# Patient Record
Sex: Female | Born: 1950 | Race: White | Hispanic: No | Marital: Single
Health system: Midwestern US, Academic
[De-identification: ages and names within clinical notes are randomized; demographics above are authoritative.]

---

## 2016-07-03 MED ORDER — ENALAPRIL MALEATE 20 MG PO TAB
20 mg | ORAL_TABLET | Freq: Two times a day (BID) | ORAL | 3 refills | Status: DC
Start: 2016-07-03 — End: 2017-03-19

## 2016-07-03 MED ORDER — POTASSIUM 99 MG PO TAB
2 | Freq: Every day | ORAL | 0 refills | 30.00000 days | Status: DC
Start: 2016-07-03 — End: 2018-03-26

## 2016-07-03 MED ORDER — SPIRONOLACTONE 25 MG PO TAB
25 mg | ORAL_TABLET | Freq: Every day | ORAL | 3 refills | 90.00000 days | Status: DC
Start: 2016-07-03 — End: 2017-03-19

## 2016-07-03 MED ORDER — CARVEDILOL 25 MG PO TAB
25 mg | ORAL_TABLET | Freq: Two times a day (BID) | ORAL | 3 refills | 90.00000 days | Status: DC
Start: 2016-07-03 — End: 2017-03-19

## 2016-08-23 ENCOUNTER — Ambulatory Visit: Admit: 2016-08-23 | Discharge: 2016-08-24 | Payer: MEDICARE

## 2016-08-23 DIAGNOSIS — Z95810 Presence of automatic (implantable) cardiac defibrillator: Secondary | ICD-10-CM

## 2016-08-24 DIAGNOSIS — I472 Ventricular tachycardia: ICD-10-CM

## 2016-08-24 DIAGNOSIS — I428 Other cardiomyopathies: Principal | ICD-10-CM

## 2016-10-02 ENCOUNTER — Ambulatory Visit: Admit: 2016-10-02 | Discharge: 2016-10-03 | Payer: MEDICARE

## 2016-10-02 ENCOUNTER — Encounter: Admit: 2016-10-02 | Discharge: 2016-10-02 | Payer: MEDICARE

## 2016-10-02 DIAGNOSIS — I951 Orthostatic hypotension: ICD-10-CM

## 2016-10-02 DIAGNOSIS — I428 Other cardiomyopathies: ICD-10-CM

## 2016-10-02 DIAGNOSIS — Z95810 Presence of automatic (implantable) cardiac defibrillator: Principal | ICD-10-CM

## 2016-10-02 DIAGNOSIS — E7800 Pure hypercholesterolemia, unspecified: ICD-10-CM

## 2016-10-02 DIAGNOSIS — I472 Ventricular tachycardia: ICD-10-CM

## 2016-10-02 DIAGNOSIS — I509 Heart failure, unspecified: ICD-10-CM

## 2016-10-02 NOTE — Progress Notes
Date of Service: 10/02/2016    Gloria Houston is a 65 y.o. female.       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  We have recently seen a regression of LV function sufficient to warrant ICD implant earlier this year.  Although she initially had nonsustained VT during her index hospitalization in March 1999 provocative study was normal and ICD at that time was not required.    A low back he has severe LV dysfunction she does not have symptoms of congestion or edema and I do not think ever has.  This means that she has stage B NYHA functional class I LV systolic dysfunction but still had sufficient reduction in LV systolic performance to warrant ICD implant for primary prevention of sudden cardiac death.  [name] continues to be symptom-free with no angina or heart failure palpitations syncope or near syncope she is never had leg pain suggesting claudication.  She is very physically active.  She lives with her brother Fredrik Houston is also very active.  I suspect that both of them are in the top 1-5% of activity level for people their age.  She is on evidence-based triple therapy for survival enhancement and maintenance of LV systolic performance with carvedilol and enalapril and spironolactone.  She has no symptoms of medication intolerance she satisfied with her quality of life and her medical program.    She has had no new major medical problems develop since I have seen her last.  She states that she has felt her heart beating but it is just strong not rapid or associated with low output congestive symptoms.  She has had some symptoms of lightheadedness upon standing with no syncope or falls.  She is not limiting her activities to avoid this symptom.           Vitals:    10/02/16 1120 10/02/16 1126   BP: 102/58 106/64   Pulse:  75   Weight: 52.4 kg (115 lb 9.6 oz)    Height: 1.6 m (5' 3) Body mass index is 20.48 kg/m???.     Past Medical History  Patient Active Problem List    Diagnosis Date Noted   ??? Idiopathic cardiomyopathy (HCC) 12/18/2008     Priority: High     a.  05/12/1997 - Cardiac Cath: EF 30%. No coronary disease  b. 2004 Echo Dr. Fran Lowes EF 40%, ToprolXL 200 from 1999-2005  c.  2/05 Reenters MAC Switch to Coreg 25 BID, then feels better  d. 7/05 Echo EF 45% LVEDD 5.1 cm  e. 7/06 Echo EF 45% LVEDD 5.6  Aspirin 81/day offered as antiplatelet agent that could possibly offset platelet aggregation effects of Ibu taken for arthritis.   f. 09/03/06  Stress Echo 12.5 min Bruce protocol treadmill: EF 45% w/ improvement w/ exercise  G. 08/17/11 Echo: EF 50%, LVEDD 5.4 LV function appears similar or slightly better than in 2008.   H. 10/13 Reg thall to assess flow and  LV Fx p Deep T inversion on EKGs:  EF by MPI 45%, no ischemia/infarct.  Echo EF 35%:    I 3//2018 Regad T-103m Scan: EF 14% LVEDV 240, No ischemia or infarct. Marked decline in LVEF since prior study.      ??? [employer] ICD in place 05/25/2016     05/24/16 left  sided DDD ICD placed Dr. Clint Bolder     ??? Congestive  heart failure, NYHA class 1 and ACC/AHA stage B [facility]) 05/24/2016     05/24/2016 [employer] dual chamber ICD      ??? NSVT (nonsustained ventricular tachycardia) (HCC) 05/24/2016   ??? Nonsustained ventricular tachycardia (HCC) 12/18/2008      a.  05/1997 - NonSustained VT noted during hospitalization to evaluate cardiomyopathy.   b.  05/13/1997 - Ventricular stimulation study by Dr. Clint Bolder: no sustained arrhythmias.     ??? Hypercholesterolemia 12/18/2008     a.  08/21/2002 - Total 196, trig 103, HDL 69, LDL 106; no meds  b. 6/06 total 183 trig 63 HDL 52 LDL 100 no meds  c. 10/06 total 144 trig 60 HDL 61 LDLd 61 Lipitor 10, DC'd   d. 08/26/06 total 197 trig 91 HDL 77 LDL 101  E. 03/10/10 lipids with total 233 trig 69 HDL 82 and LDL 120 no statins  F. 02/14/11 Total 234 Trig 104 HDL 80 LDL 132 no meds. 7/13 inf T inversion on EKG, Lipiitor 10 resumed, stress Test>>No ischemia  G 4.2015 Stops Atorvastatin 5 d.t myalgias, Tolerating simvastatin 20 as substituete 12/2013.           Review of Systems   Constitution: Negative.   HENT: Negative.    Eyes: Negative.    Cardiovascular: Positive for irregular heartbeat and near-syncope.   Respiratory: Negative.    Endocrine: Negative.    Hematologic/Lymphatic: Negative.    Skin: Negative.    Musculoskeletal: Positive for arthritis.   Gastrointestinal: Positive for diarrhea.   Genitourinary: Negative.    Neurological: Positive for dizziness and light-headedness.   Psychiatric/Behavioral: Negative.    Allergic/Immunologic: Negative.        Physical Exam  General: Patient looks generally healthy.  Appearance consistent with BMI calculated at 23  Skin warm and dry, non icteric,       Her left-sided ICD incision ishealed.  Pupils equal and round         Thyroid not enlarged.  Neck veins: CVP <6 normal, no V wave, No hepato-jugular reflux   Carotids: no bruits  Respiratory: Breathing comfortably. Lungs clear to auscultation and percussion.   Cardiac: Regular rhythm.   Fourth heart sound, no rub or third sound. No murmur  Abdomen soft, non-tender, no masses or bruits   Legs/feet: Adequate PT pulses, no edema.   Motor: Normal muscle strength.      Cognitive: Good insight, clear historian, no depression     Cardiovascular Studies  EKG shows predominance of AV pacing with some limited view of neuro complex sinus beats in the lateral precordial leads.  Problems Addressed Today  Encounter Diagnoses   Name Primary?   ??? Congestive heart failure, NYHA class 1 and ACC/AHA stage B (HCC) Yes   ??? [employer] ICD in place    ??? Idiopathic cardiomyopathy (HCC)    ??? Orthostatic hypotension        Assessment and Plan     Dierra is doing extremely well.  She has no findings of fluid overload.  The only thing on exam reveals cardiac problem is her ICD in the left chest.  I added spironolactone last visit with stable potassium.    She has had some lightheadedness which I think is related to volume contraction in the heat and her medications.  I told her to be cautious as she gets up and down in the summer.    She told me that she could feel her heart beating which told her is normal in  the absence of tachypalpitations.  Her blood pressure is satisfactory.  She asked about flying and I told her that I really had no reservations about her traveling anywhere in the world on his she kept herself reasonably close to medical care.  I used travel [place] [employer] is the travel location that I thought would be ill advised that anywhere in the  [place] I think would be fine.    When I saw her last visit in April noted that she was to highly functional to warrant conversion to [facility] based on current indications and data.  I will see her again in 4-6 months with a six-month visit being in January.  I think she is hard enough to make the trip even in bad weather.  NB: This document was prepared within the Epic(TM) EMR using templates developed by the [facility] of [facility]. It can be accessed online through [facility] feature by clinicians at other Epic institutions.  The free text in this document, was generated through Dragon(TM) software.  Editing and proofreading were done by the author of this document Dr. Mable Paris MD, [facility] principally at the point of care.  In spite of the author's best effort to identify every error introduced by voice to text dictation, some errors that may represent misspelling or misstatements of what was dictated may persist.  If there are questions about content in this document please contact Dr. Hale Bogus.    The written information I provided Ms. Mcneel at the conclusion of today's encounter is as  follows:   Patient Instructions   You are doing very well.  Your exam shows no evidence of fluid overload. The only clue on examthat you have some heart problem is your defibrillator.  Potassium did not go up after the spironolactone.  It was 4.2 baseline and 4.1 on May 14.  Both of these are normal.    The heart protection medicines tend to reduce her body's ability to tighten the blood vessels against gravity.  That tends to make you more lightheaded and dizzy particularly when you stand up suddenly.  If you are dehydrated which is more likely in the summer that will also reduce her blood pressure and contribute to dizziness.  That is why the heat can bother you more because she will lose more fluid and be more likely to be dehydrated and get lower blood pressure and feel dizzy.    Feeling the beat of your heart in the chest is a good thing.  I had a heart transplant patient tell me once that he woke up after the heart transplant and felt his heart beating.  That was the first time he felt his heartbeat in 5 years because his heart is been too weak for him to feel the beats.  It is only of the beats are going extremely fast that you should be concerned.  Your pacemaker incision is barely visible.  There is still a little fluid around the pacemaker that will resorb over the next couple of months.    Her blood pressure is unchanged from the time before the spironolactone.  Your heart medications that strengthen and protect your heart include carvedilol 25 mg twice daily Vasotec 20 mg twice daily and spironolactone 25 mg daily those are all solid doses of highly beneficial medications.  Stay with them as I see no harm from them and I know they are providing you protection.    I have  no reservations about you flying anywhere in the world.  You are smart about what you would set out to do and give yourself a margin for air rather than doing something really crazy like trying to climb [place].    He will think you need another follow-up visit for 6 months.  That would mean coming in in January.  I think you are hard enough to come in in January.  I am telling the febrile 66 year old's to either come in in December or March.     Call in if you have problems or questions.   Marissa Nestle, MD              Current Medications (including today's revisions)  ??? ascorbic acid (VITAMIN C) 500 mg PO tablet Take 500 mg by mouth Daily.   ??? Aspirin 81 mg PO Tab Take 1 Tab by mouth Daily.   ??? CALCIUM CARBONATE/VITAMIN D3 (CALCIUM 500 + D PO) Take  by mouth. 1 tablet by mouth three times a day    ??? carvedilol (COREG) 25 mg tablet Take 1 tablet by mouth twice daily with meals.   ??? enalapril (VASOTEC) 20 mg tablet Take 1 tablet by mouth twice daily.   ??? ibuprofen (MOTRIN) 200 mg PO tablet Take 3 Tabs by mouth twice daily as needed.   ??? LACTOBACILLUS ACIDOPHILUS (FLORAJEN PO) Take  by mouth daily.   ??? multivitamin (THERAGRAN) PO per tablet Take 1 Tab by mouth Daily.   ??? omega-3s-dha-epa-fish oil 1,400 mg/5 mL liqd Take 1 tablet by mouth daily.   ??? OMEGA-3S/DHA/EPA/FISH OIL/D3 (VITAMIN-D + OMEGA-3 PO) Take 5,000 mg by mouth daily.   ??? Potassium 99 mg tab Take 2 tablets by mouth daily.   ??? spironolactone (ALDACTONE) 25 mg tablet Take 1 tablet by mouth daily. Take with food.   ??? vitamins, B complex tab Take 1 Tab by mouth daily.

## 2016-10-05 ENCOUNTER — Encounter: Admit: 2016-10-05 | Discharge: 2016-10-05 | Payer: MEDICARE

## 2016-10-05 DIAGNOSIS — I428 Other cardiomyopathies: Principal | ICD-10-CM

## 2016-10-05 DIAGNOSIS — E7800 Pure hypercholesterolemia, unspecified: ICD-10-CM

## 2016-10-05 DIAGNOSIS — I472 Ventricular tachycardia: ICD-10-CM

## 2016-11-22 ENCOUNTER — Ambulatory Visit: Admit: 2016-11-22 | Discharge: 2016-11-23 | Payer: MEDICARE

## 2016-11-23 DIAGNOSIS — I255 Ischemic cardiomyopathy: Principal | ICD-10-CM

## 2016-11-23 DIAGNOSIS — Z95810 Presence of automatic (implantable) cardiac defibrillator: ICD-10-CM

## 2016-11-23 DIAGNOSIS — I472 Ventricular tachycardia: ICD-10-CM

## 2016-12-06 ENCOUNTER — Ambulatory Visit: Admit: 2016-12-06 | Discharge: 2016-12-07 | Payer: MEDICARE

## 2016-12-06 DIAGNOSIS — I5022 Chronic systolic (congestive) heart failure: ICD-10-CM

## 2016-12-06 DIAGNOSIS — Z959 Presence of cardiac and vascular implant and graft, unspecified: ICD-10-CM

## 2016-12-06 DIAGNOSIS — Z95810 Presence of automatic (implantable) cardiac defibrillator: Principal | ICD-10-CM

## 2017-02-21 ENCOUNTER — Ambulatory Visit: Admit: 2017-02-21 | Discharge: 2017-02-22 | Payer: MEDICARE

## 2017-02-21 DIAGNOSIS — I472 Ventricular tachycardia: Secondary | ICD-10-CM

## 2017-02-22 DIAGNOSIS — I255 Ischemic cardiomyopathy: Principal | ICD-10-CM

## 2017-02-22 DIAGNOSIS — Z95810 Presence of automatic (implantable) cardiac defibrillator: ICD-10-CM

## 2017-03-19 ENCOUNTER — Encounter: Admit: 2017-03-19 | Discharge: 2017-03-19 | Payer: MEDICARE

## 2017-03-19 ENCOUNTER — Ambulatory Visit: Admit: 2017-03-19 | Discharge: 2017-03-20 | Payer: MEDICARE

## 2017-03-19 DIAGNOSIS — I509 Heart failure, unspecified: ICD-10-CM

## 2017-03-19 DIAGNOSIS — I429 Cardiomyopathy, unspecified: ICD-10-CM

## 2017-03-19 DIAGNOSIS — I420 Dilated cardiomyopathy: ICD-10-CM

## 2017-03-19 DIAGNOSIS — I472 Ventricular tachycardia: ICD-10-CM

## 2017-03-19 DIAGNOSIS — Z95810 Presence of automatic (implantable) cardiac defibrillator: Principal | ICD-10-CM

## 2017-03-19 DIAGNOSIS — I428 Other cardiomyopathies: Principal | ICD-10-CM

## 2017-03-19 DIAGNOSIS — E7800 Pure hypercholesterolemia, unspecified: ICD-10-CM

## 2017-03-19 LAB — COMPREHENSIVE METABOLIC PANEL
Lab: 1
Lab: 106
Lab: 140
Lab: 25
Lab: 30 — ABNORMAL HIGH (ref 9.8–20.1)
Lab: 6.5
Lab: 9.9
Lab: 94

## 2017-03-19 LAB — MAGNESIUM: Lab: 2.3

## 2017-03-19 MED ORDER — ENALAPRIL MALEATE 20 MG PO TAB
20 mg | ORAL_TABLET | Freq: Two times a day (BID) | ORAL | 3 refills | Status: AC
Start: 2017-03-19 — End: 2018-03-25

## 2017-03-19 MED ORDER — SPIRONOLACTONE 25 MG PO TAB
25 mg | ORAL_TABLET | Freq: Every day | ORAL | 3 refills | 46.00000 days | Status: AC
Start: 2017-03-19 — End: 2018-03-25

## 2017-03-19 MED ORDER — CARVEDILOL 25 MG PO TAB
25 mg | ORAL_TABLET | Freq: Two times a day (BID) | ORAL | 3 refills | 90.00000 days | Status: AC
Start: 2017-03-19 — End: 2018-03-25

## 2017-03-20 ENCOUNTER — Encounter: Admit: 2017-03-20 | Discharge: 2017-03-20 | Payer: MEDICARE

## 2017-03-20 DIAGNOSIS — Z95810 Presence of automatic (implantable) cardiac defibrillator: Principal | ICD-10-CM

## 2017-03-20 DIAGNOSIS — I420 Dilated cardiomyopathy: ICD-10-CM

## 2017-03-20 DIAGNOSIS — I472 Ventricular tachycardia: ICD-10-CM

## 2017-03-20 DIAGNOSIS — I429 Cardiomyopathy, unspecified: ICD-10-CM

## 2017-05-24 ENCOUNTER — Ambulatory Visit: Admit: 2017-05-23 | Discharge: 2017-05-24 | Payer: MEDICARE

## 2017-05-24 DIAGNOSIS — Z95810 Presence of automatic (implantable) cardiac defibrillator: ICD-10-CM

## 2017-05-24 DIAGNOSIS — I255 Ischemic cardiomyopathy: Principal | ICD-10-CM

## 2017-05-24 DIAGNOSIS — I472 Ventricular tachycardia: ICD-10-CM

## 2017-06-13 ENCOUNTER — Encounter: Admit: 2017-06-13 | Discharge: 2017-06-13 | Payer: MEDICARE

## 2017-06-13 DIAGNOSIS — I428 Other cardiomyopathies: Principal | ICD-10-CM

## 2017-06-13 DIAGNOSIS — E7800 Pure hypercholesterolemia, unspecified: ICD-10-CM

## 2017-06-13 DIAGNOSIS — I472 Ventricular tachycardia: ICD-10-CM

## 2017-08-22 ENCOUNTER — Ambulatory Visit: Admit: 2017-08-22 | Discharge: 2017-08-23 | Payer: MEDICARE

## 2017-08-23 DIAGNOSIS — Z95810 Presence of automatic (implantable) cardiac defibrillator: ICD-10-CM

## 2017-08-23 DIAGNOSIS — I472 Ventricular tachycardia: ICD-10-CM

## 2017-08-23 DIAGNOSIS — I255 Ischemic cardiomyopathy: Principal | ICD-10-CM

## 2017-09-17 ENCOUNTER — Ambulatory Visit: Admit: 2017-09-17 | Discharge: 2017-09-18 | Payer: MEDICARE

## 2017-09-17 ENCOUNTER — Encounter: Admit: 2017-09-17 | Discharge: 2017-09-17 | Payer: MEDICARE

## 2017-09-17 DIAGNOSIS — E7800 Pure hypercholesterolemia, unspecified: ICD-10-CM

## 2017-09-17 DIAGNOSIS — R011 Cardiac murmur, unspecified: ICD-10-CM

## 2017-09-17 DIAGNOSIS — I509 Heart failure, unspecified: ICD-10-CM

## 2017-09-17 DIAGNOSIS — I420 Dilated cardiomyopathy: ICD-10-CM

## 2017-09-17 DIAGNOSIS — I428 Other cardiomyopathies: Principal | ICD-10-CM

## 2017-09-17 DIAGNOSIS — I472 Ventricular tachycardia: ICD-10-CM

## 2017-09-17 LAB — BASIC METABOLIC PANEL
Lab: 1.1 meq/L — ABNORMAL HIGH
Lab: 10 — ABNORMAL HIGH (ref 8.4–10.2)
Lab: 105 g/dL (ref 3.4–4.8)
Lab: 12
Lab: 140 mg/dL (ref 0.3–1.2)
Lab: 28 mg/dL (ref 0.57–1.11)
Lab: 28 ml/min/1.73 m2 — ABNORMAL HIGH (ref 9.8–20.1)
Lab: 5.1 g/dL
Lab: 88

## 2017-09-17 LAB — BNP (B-TYPE NATRIURETIC PEPTI): Lab: 105 IU/L — ABNORMAL HIGH (ref 0–100)

## 2017-09-18 ENCOUNTER — Encounter: Admit: 2017-09-18 | Discharge: 2017-09-18 | Payer: MEDICARE

## 2017-09-18 DIAGNOSIS — I472 Ventricular tachycardia: ICD-10-CM

## 2017-09-18 DIAGNOSIS — E7800 Pure hypercholesterolemia, unspecified: Principal | ICD-10-CM

## 2017-09-18 DIAGNOSIS — I509 Heart failure, unspecified: ICD-10-CM

## 2017-09-24 ENCOUNTER — Encounter: Admit: 2017-09-24 | Discharge: 2017-09-24 | Payer: MEDICARE

## 2017-09-24 ENCOUNTER — Ambulatory Visit: Admit: 2017-09-24 | Discharge: 2017-09-25 | Payer: MEDICARE

## 2017-09-24 DIAGNOSIS — I509 Heart failure, unspecified: Principal | ICD-10-CM

## 2017-10-14 ENCOUNTER — Encounter: Admit: 2017-10-14 | Discharge: 2017-10-14 | Payer: MEDICARE

## 2017-11-21 ENCOUNTER — Ambulatory Visit: Admit: 2017-11-21 | Discharge: 2017-11-22 | Payer: MEDICARE

## 2017-11-21 DIAGNOSIS — Z95810 Presence of automatic (implantable) cardiac defibrillator: Secondary | ICD-10-CM

## 2017-11-22 DIAGNOSIS — I472 Ventricular tachycardia: ICD-10-CM

## 2017-11-22 DIAGNOSIS — I255 Ischemic cardiomyopathy: Principal | ICD-10-CM

## 2017-12-04 ENCOUNTER — Encounter: Admit: 2017-12-04 | Discharge: 2017-12-04 | Payer: MEDICARE

## 2017-12-04 DIAGNOSIS — I472 Ventricular tachycardia: Principal | ICD-10-CM

## 2018-02-20 ENCOUNTER — Ambulatory Visit: Admit: 2018-02-20 | Discharge: 2018-02-20 | Payer: MEDICARE

## 2018-02-20 DIAGNOSIS — Z95810 Presence of automatic (implantable) cardiac defibrillator: ICD-10-CM

## 2018-02-20 DIAGNOSIS — I472 Ventricular tachycardia: ICD-10-CM

## 2018-02-20 DIAGNOSIS — I255 Ischemic cardiomyopathy: Principal | ICD-10-CM

## 2018-03-25 ENCOUNTER — Encounter: Admit: 2018-03-25 | Discharge: 2018-03-25 | Payer: MEDICARE

## 2018-03-25 ENCOUNTER — Ambulatory Visit: Admit: 2018-03-25 | Discharge: 2018-03-25 | Payer: MEDICARE

## 2018-03-25 DIAGNOSIS — Z95810 Presence of automatic (implantable) cardiac defibrillator: Secondary | ICD-10-CM

## 2018-03-25 DIAGNOSIS — I509 Heart failure, unspecified: Secondary | ICD-10-CM

## 2018-03-25 DIAGNOSIS — I472 Ventricular tachycardia: Secondary | ICD-10-CM

## 2018-03-25 DIAGNOSIS — I429 Cardiomyopathy, unspecified: Secondary | ICD-10-CM

## 2018-03-25 DIAGNOSIS — I428 Other cardiomyopathies: Secondary | ICD-10-CM

## 2018-03-25 DIAGNOSIS — E7800 Pure hypercholesterolemia, unspecified: Secondary | ICD-10-CM

## 2018-03-25 LAB — BASIC METABOLIC PANEL
Lab: 106
Lab: 139
Lab: 5.5 — ABNORMAL HIGH (ref 3.5–5.1)

## 2018-03-25 MED ORDER — CARVEDILOL 25 MG PO TAB
25 mg | ORAL_TABLET | Freq: Two times a day (BID) | ORAL | 3 refills | 90.00000 days | Status: AC
Start: 2018-03-25 — End: 2019-04-09

## 2018-03-25 MED ORDER — SPIRONOLACTONE 25 MG PO TAB
25 mg | ORAL_TABLET | Freq: Every day | ORAL | 3 refills | 90.00000 days | Status: AC
Start: 2018-03-25 — End: 2018-03-26

## 2018-03-25 MED ORDER — ENALAPRIL MALEATE 20 MG PO TAB
20 mg | ORAL_TABLET | Freq: Two times a day (BID) | ORAL | 3 refills | Status: AC
Start: 2018-03-25 — End: 2019-04-08

## 2018-03-26 ENCOUNTER — Encounter: Admit: 2018-03-26 | Discharge: 2018-03-26 | Payer: MEDICARE

## 2018-03-26 DIAGNOSIS — I420 Dilated cardiomyopathy: Secondary | ICD-10-CM

## 2018-03-26 DIAGNOSIS — E875 Hyperkalemia: Secondary | ICD-10-CM

## 2018-03-26 DIAGNOSIS — I509 Heart failure, unspecified: Secondary | ICD-10-CM

## 2018-03-26 DIAGNOSIS — I429 Cardiomyopathy, unspecified: Secondary | ICD-10-CM

## 2018-03-26 DIAGNOSIS — Z95810 Presence of automatic (implantable) cardiac defibrillator: Secondary | ICD-10-CM

## 2018-03-26 MED ORDER — SPIRONOLACTONE 25 MG PO TAB
12.5 mg | ORAL_TABLET | Freq: Every day | ORAL | 3 refills | 90.00000 days | Status: AC
Start: 2018-03-26 — End: 2019-09-17

## 2018-04-02 ENCOUNTER — Encounter: Admit: 2018-04-02 | Discharge: 2018-04-02 | Payer: MEDICARE

## 2018-04-02 DIAGNOSIS — I509 Heart failure, unspecified: Secondary | ICD-10-CM

## 2018-04-02 DIAGNOSIS — I420 Dilated cardiomyopathy: Secondary | ICD-10-CM

## 2018-04-02 DIAGNOSIS — E875 Hyperkalemia: Secondary | ICD-10-CM

## 2018-04-02 LAB — BASIC METABOLIC PANEL
Lab: 10 U/L (ref 25–110)
Lab: 10 U/L (ref 7–56)
Lab: 110 g/dL — ABNORMAL HIGH (ref 70–105)
Lab: 138 mg/dL — ABNORMAL HIGH (ref 70–100)
Lab: 4.4 mg/dL (ref 7–25)

## 2018-04-12 ENCOUNTER — Encounter: Admit: 2018-04-12 | Discharge: 2018-04-12 | Payer: MEDICARE

## 2018-04-12 DIAGNOSIS — Z95810 Presence of automatic (implantable) cardiac defibrillator: Principal | ICD-10-CM

## 2018-05-22 ENCOUNTER — Ambulatory Visit: Admit: 2018-05-22 | Discharge: 2018-05-23 | Payer: MEDICARE

## 2018-05-22 DIAGNOSIS — I472 Ventricular tachycardia: Secondary | ICD-10-CM

## 2018-05-22 DIAGNOSIS — Z95810 Presence of automatic (implantable) cardiac defibrillator: Secondary | ICD-10-CM

## 2018-05-23 ENCOUNTER — Encounter: Admit: 2018-05-23 | Discharge: 2018-05-23 | Payer: MEDICARE

## 2018-05-23 DIAGNOSIS — I255 Ischemic cardiomyopathy: Principal | ICD-10-CM

## 2018-08-21 ENCOUNTER — Ambulatory Visit: Admit: 2018-08-21 | Discharge: 2018-08-22

## 2018-08-21 DIAGNOSIS — Z95810 Presence of automatic (implantable) cardiac defibrillator: Secondary | ICD-10-CM

## 2018-08-22 ENCOUNTER — Encounter: Admit: 2018-08-22 | Discharge: 2018-08-22

## 2018-08-22 DIAGNOSIS — I472 Ventricular tachycardia: Secondary | ICD-10-CM

## 2018-08-22 DIAGNOSIS — I428 Other cardiomyopathies: Secondary | ICD-10-CM

## 2018-09-23 ENCOUNTER — Encounter: Admit: 2018-09-23 | Discharge: 2018-09-23

## 2018-09-23 ENCOUNTER — Ambulatory Visit: Admit: 2018-09-23 | Discharge: 2018-09-24

## 2018-09-23 DIAGNOSIS — I472 Ventricular tachycardia: Secondary | ICD-10-CM

## 2018-09-23 DIAGNOSIS — I428 Other cardiomyopathies: Secondary | ICD-10-CM

## 2018-09-23 DIAGNOSIS — E7800 Pure hypercholesterolemia, unspecified: Secondary | ICD-10-CM

## 2018-09-23 DIAGNOSIS — Z95810 Presence of automatic (implantable) cardiac defibrillator: Secondary | ICD-10-CM

## 2018-09-23 DIAGNOSIS — I420 Dilated cardiomyopathy: Secondary | ICD-10-CM

## 2018-09-23 LAB — BASIC METABOLIC PANEL
Lab: 1.1 — ABNORMAL HIGH (ref 0.57–1.11)
Lab: 105
Lab: 13
Lab: 139
Lab: 26
Lab: 31 — ABNORMAL HIGH (ref 9.8–20.1)
Lab: 4.6
Lab: 50 — ABNORMAL LOW (ref >59)
Lab: 84
Lab: 9.8

## 2018-09-23 NOTE — Progress Notes
Date of Service: 09/23/2018    Gloria Houston is a 68 y.o. female.       HPI     Gloria Houston returns for follow-up of her idiopathic familial cardiomyopathy that has always been asymptomatic but associated with a EF decline sufficient to warrant ICD implant.  Her quality of life has been more impaired by having the ICD put in and having to take her meds than anything that the disease is ever done.  She swims and exercises and other manners well more than 3 hours a week.  She has had no change in her functional status.  She is essentially still in stage B heart failure which is asymptomatic LV dysfunction.  Earlier this year her potassium came back 5.5 while on Aldactone 25 so we cut the dose to 12.5 and rechecked with potassium following below 5.  She did not notice any change in the quality of life her tendency for edema when the Aldactone dose was reduced.  That she has had no other changes in her health status.       Vitals:    09/23/18 0814 09/23/18 0844   BP: 106/68 104/66   BP Source: Arm, Left Upper Arm, Right Upper   Pulse: 92    SpO2: 98%    Weight: 54 kg (119 lb)    Height: 1.6 m (5' 3)    PainSc: Zero      Body mass index is 21.08 kg/m???.     Past Medical History  Patient Active Problem List    Diagnosis Date Noted   ??? Systolic murmur 09/17/2017      September 17, 2017 new systolic murmur noted likely mitral regurgitation echo Doppler ordered to verify valve function and recheck LV function.     ??? [employer] ICD in place 05/25/2016     05/24/16 left   sided DDD ICD placed Dr. Clint Bolder after EF 14%$ on reg thall w no ischemia or infarct.     ??? Congestive heart failure, NYHA class 1 and ACC/AHA stage B [facility]) 05/24/2016     05/24/2016 [employer] dual chamber ICD      ??? NSVT (nonsustained ventricular tachycardia) (HCC) 05/24/2016   ??? 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  05/12/1997 - Cardiac Cath: EF 30%. No coronary disease b. 2004 Echo Dr. Fran Lowes EF 40%, ToprolXL 200 from 1999-2005  c.  2/05 Reenters MAC Switch to Coreg 25 BID, then feels better  d. 7/05 Echo EF 45% LVEDD 5.1 cm  e. 7/06 Echo EF 45% LVEDD 5.6  Aspirin 81/day offered as antiplatelet agent that could possibly offset platelet aggregation effects of Ibu taken for arthritis.   f. 09/03/06  Stress Echo 12.5 min Bruce protocol treadmill: EF 45% w/ improvement w/ exercise  G. 08/17/11 Echo: EF 50%, LVEDD 5.4 LV function appears similar or slightly better than in 2008.   H. 10/13 Reg thall to assess flow and  LV Fx p Deep T inversion on EKGs:  EF by MPI 45%, no ischemia/infarct.  Echo EF 35%:    I 3//2018 Regad T-24m Scan: EF 14% LVEDV 240, No ischemia or infarct. Marked decline in LVEF since prior study.      ??? Nonsustained ventricular tachycardia (HCC) 12/18/2008      a.  05/1997 - NonSustained VT noted during hospitalization to evaluate cardiomyopathy.   b.  05/13/1997 - Ventricular stimulation study by Dr. Clint Bolder: no sustained arrhythmias.  c. 05/24/2016 ICD implant w EF decline to 14%     ??? Hypercholesterolemia 12/18/2008     a.  08/21/2002 - Total 196, trig 103, HDL 69, LDL 106; no meds  b. 6/06 total 183 trig 63 HDL 52 LDL 100 no meds  c. 10/06 total 144 trig 60 HDL 61 LDLd 61 Lipitor 10, DC'd   d. 08/26/06 total 197 trig 91 HDL 77 LDL 101  E. 03/10/10 lipids with total 233 trig 69 HDL 82 and LDL 120 no statins  F. 02/14/11 Total 234 Trig 104 HDL 80 LDL 132 no meds. 7/13 inf T inversion on EKG, Lipiitor 10 resumed, stress Test>>No ischemia  G 4.2015 Stops Atorvastatin 5 d.t myalgias, Tolerating simvastatin 20 as substituete 12/2013.           Review of Systems   Constitution: Negative.   HENT: Negative.    Eyes: Negative.    Cardiovascular: Positive for irregular heartbeat.   Respiratory: Negative.    Endocrine: Negative.    Hematologic/Lymphatic: Negative.    Skin: Negative.    Musculoskeletal: Positive for arthritis.   Gastrointestinal: Negative. Genitourinary: Negative.    Neurological: Negative.    Psychiatric/Behavioral: Negative.    Allergic/Immunologic: Negative.    14 organ system review noted. It is negative except as reported in current narrative or above in the ROS section. This is a patient centered review of systems that was stated by the patient in her terms prior to my personal problem oriented interview with the patient     Physical Exam  General: Patient looks generally healthy.  Appearance consistent with BMI calculated at 23 Wearing medical face mask   Skin warm and dry, non icteric,        Pupils equal and round         Thyroid not enlarged.  Neck veins: CVP <6 normal, no V wave, No hepato-jugular reflux   Carotids: no bruits   Chest: Left delto-pectoral electronic pulse generator   Respiratory: Breathing comfortably. Lungs clear to auscultation and percussion.   Cardiac: Regular rhythm.   Fourth heart sound, no rub or third sound.No murmur  Abdomen soft, non-tender, no masses or bruits   Legs/feet: Adequate PT pulses, no edema.   Motor: Normal muscle strength.      Cognitive: Good insight, clear historian, no depression     Cardiovascular Studies  Her ICD interrogation today was totally favorable.  She is not had any atrial or ventricular events no VT therapies.  There is nothing to suggest fluid accumulation.  She did not need reprogramming of any parameter.  She is not approaching replacement interval.    Problems Addressed Today  No diagnosis found.    Assessment and Plan     Gloria Houston remains asymptomatic with [place] HA class I AHA stage B heart failure with ICD implant on optimal doses of ACE inhibitor beta-blocker and aldosterone inhibitor.  Will check her potassium today to make sure it is in range.  I explained to her that the aldosterone blocker Aldactone helps protect the heart and reduce arrhythmias and improve survival but in some cases has to have dose reduced because of a tendency for potassium retention. She has no findings of volume overload.  Her exam has never suggested significant CV disease.    I talked to her about her last echo which show yielded improved calculated EF but visual impression of slight deterioration.  In this situation it is evident that when she is stable and there discordant findings  of improvement or deterioration in cardiac function on the echo that things are stable.  I will continue to see her at 24-month intervals.  She knows that I will see her on shorter notice as needed given the limitations in my time in [place]..      NB: The free text in this document was generated through Dragon(TM) software with editing and proofreading  done by the author of this document Dr. Mable Paris MD, [facility] principally at the point of care. Some errors may persist.  If there are questions about content in this document please contact Dr. Hale Bogus.    The written information I provided Gloria Houston at the conclusion of today's encounter is as  follows:    Patient Instructions   I think you doing really well.  You understand why we cut the potassium the potassium saving drug spironolactone dose down.  It is because your potassium was too high but dropped when we cut the dose.  We need to recheck the electrolytes today to make sure things still look good.    The drug I told you was not warranted since you have never been symptomatic and are doing so well is Entresto.  You see this advertised on TV.  The only reason I mention it is that I want you to know that I am thinking about it for you but you do not need it if you start to get more symptomatic or something then we could think about making the switch.  That drug also tends to increase potassium more than the Vasotec so that could be a problem as well.  All in all it is best for you to stay with what is working.    I do not have to talk to you about losing weight since your weight is really good for your height and you exercise well with excellent blood pressure.  All in all you are doing tremendously well in the face of this familial heart weakness problem.  See again in 6 months.  Blood test today is BMP to check potassium.     It's good to see you today.  Call in if you have problems or questions.   Gloria Nestle, MD              Current Medications (including today's revisions)  ??? ascorbic acid (VITAMIN C) 500 mg PO tablet Take 500 mg by mouth Daily.   ??? Aspirin 81 mg PO Tab Take 1 Tab by mouth Daily.   ??? CALCIUM CARBONATE/VITAMIN D3 (CALCIUM 500 + D PO) Take  by mouth. 1 tablet by mouth three times a day    ??? carvedilol (COREG) 25 mg tablet Take one tablet by mouth twice daily with meals.   ??? enalapril (VASOTEC) 20 mg tablet Take one tablet by mouth twice daily.   ??? ibuprofen (MOTRIN) 200 mg PO tablet Take 3 Tabs by mouth twice daily as needed.   ??? LACTOBACILLUS ACIDOPHILUS (FLORAJEN PO) Take  by mouth daily.   ??? multivitamin (THERAGRAN) PO per tablet Take 1 Tab by mouth Daily.   ??? omega-3s-dha-epa-fish oil 1,400 mg/5 mL liqd Take 1 tablet by mouth daily.   ??? OMEGA-3S/DHA/EPA/FISH OIL/D3 (VITAMIN-D + OMEGA-3 PO) Take 5,000 mg by mouth daily.   ??? spironolactone (ALDACTONE) 25 mg tablet Take one-half tablet by mouth daily. Take with food.   ??? vitamins, B complex tab Take 1 Tab by mouth daily.   ??? vits A,C,E/zinc/copper (VISION-VITE PRESERVE PO) Take  by mouth twice daily.

## 2018-09-23 NOTE — Patient Instructions
I think you doing really well.  You understand why we cut the potassium the potassium saving drug spironolactone dose down.  It is because your potassium was too high but dropped when we cut the dose.  We need to recheck the electrolytes today to make sure things still look good.    The drug I told you was not warranted since you have never been symptomatic and are doing so well is Entresto.  You see this advertised on TV.  The only reason I mention it is that I want you to know that I am thinking about it for you but you do not need it if you start to get more symptomatic or something then we could think about making the switch.  That drug also tends to increase potassium more than the Vasotec so that could be a problem as well.  All in all it is best for you to stay with what is working.    I do not have to talk to you about losing weight since your weight is really good for your height and you exercise well with excellent blood pressure.  All in all you are doing tremendously well in the face of this familial heart weakness problem.  See again in 6 months.  Blood test today is BMP to check potassium.     It's good to see you today.  Call in if you have problems or questions.   Lenoard Aden, MD

## 2018-09-24 ENCOUNTER — Encounter: Admit: 2018-09-24 | Discharge: 2018-09-24

## 2018-09-24 DIAGNOSIS — I420 Dilated cardiomyopathy: Secondary | ICD-10-CM

## 2018-09-24 DIAGNOSIS — I472 Ventricular tachycardia: Secondary | ICD-10-CM

## 2018-11-10 ENCOUNTER — Encounter: Admit: 2018-11-10 | Discharge: 2018-11-10

## 2018-11-10 DIAGNOSIS — I472 Ventricular tachycardia: Secondary | ICD-10-CM

## 2019-04-08 ENCOUNTER — Encounter: Admit: 2019-04-08 | Discharge: 2019-04-08 | Payer: MEDICARE

## 2019-04-08 MED ORDER — ENALAPRIL MALEATE 20 MG PO TAB
20 mg | ORAL_TABLET | Freq: Two times a day (BID) | ORAL | 3 refills | Status: AC
Start: 2019-04-08 — End: ?

## 2019-04-09 ENCOUNTER — Encounter: Admit: 2019-04-09 | Discharge: 2019-04-09 | Payer: MEDICARE

## 2019-04-09 MED ORDER — CARVEDILOL 25 MG PO TAB
25 mg | ORAL_TABLET | Freq: Two times a day (BID) | ORAL | 3 refills | 90.00000 days | Status: AC
Start: 2019-04-09 — End: ?

## 2019-05-05 ENCOUNTER — Encounter: Admit: 2019-05-05 | Discharge: 2019-05-05 | Payer: MEDICARE

## 2019-05-05 DIAGNOSIS — I472 Ventricular tachycardia: Secondary | ICD-10-CM

## 2019-05-05 DIAGNOSIS — I509 Heart failure, unspecified: Secondary | ICD-10-CM

## 2019-05-05 DIAGNOSIS — Z95810 Presence of automatic (implantable) cardiac defibrillator: Secondary | ICD-10-CM

## 2019-05-05 DIAGNOSIS — I428 Other cardiomyopathies: Secondary | ICD-10-CM

## 2019-05-05 DIAGNOSIS — I10 Essential (primary) hypertension: Secondary | ICD-10-CM

## 2019-05-05 DIAGNOSIS — I952 Hypotension due to drugs: Secondary | ICD-10-CM

## 2019-05-05 DIAGNOSIS — E7800 Pure hypercholesterolemia, unspecified: Secondary | ICD-10-CM

## 2019-05-05 DIAGNOSIS — I420 Dilated cardiomyopathy: Secondary | ICD-10-CM

## 2019-05-05 NOTE — Progress Notes
Date of Service: 05/05/2019    Gloria Houston is a 69 y.o. female.       HPI     Gloria Houston returns for follow-up of her cardiomyopathy that is been present with low systolic function since 1999 when she had an EP study that showed no inducible arrhythmias.  Her EF however stayed high enough to avoid ICD until it was a drop in spite of medications to 15% on the nuclear scan and ICD was implanted in 2018 by Dr. Clint Bolder.Gloria Houston has never had symptoms of heart failure and has been wearing the most physically active patients have known over the years but her ejection fraction has been low.  Her brother has similar issues with dilated cardiomyopathy so there is a familial component that we have not fully evaluated by genetic analysis.  She is here for routine scheduled visit but reports that yesterday she felt bad and had blood pressure readings on her home cuff in the 90 range.  This is lower than usual.  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  She has had no intercurrent illnesses she has not been feeling bad in any way no fevers chills sweats shortness of breath or swelling.  When I was talking to her about causes of dehydration such as diarrhea the she stated that she probably does not drink as much water she should but she is not losing any fluid by any other route.    She has been on conventional heart failure therapies with carvedilol 25 twice daily enalapril 20 mg and spironolactone 12.5mg  daily for several years.  I had her initially on spironolactone 25 but back down to 12.5 the potassium reading of 5.5.  I have been discussing starting Entresto on her for over 3 years based on my review of her notes from clinic but have never started her on them because her overall functional status was so good and was not sure it was warranted in the setting where she would go to   a   higher co-pay agent.   Vitals:    05/05/19 1348 05/05/19 1401   BP: (!) 88/66 (!) 86/64   BP Source: Arm, Left Upper Arm, Right Upper   Patient Position: Sitting Sitting   Pulse: 80    SpO2: 97%    Weight: 55 kg (121 lb 3.2 oz)    Height: 1.6 m (5' 3)    PainSc: Zero      Body mass index is 21.47 kg/m?Marland Kitchen     Past Medical History  Patient Active Problem List    Diagnosis Date Noted   ? Systolic murmur 09/17/2017      September 17, 2017 new systolic murmur noted likely mitral regurgitation echo Doppler ordered to verify valve function and recheck LV function.     ? [employer] ICD in place 05/25/2016     05/24/16 left   sided DDD ICD placed Dr. Clint Bolder after EF 14%$ on reg thall w no ischemia or infarct.     ? Congestive heart failure, NYHA class 1 and ACC/AHA stage B [facility]) 05/24/2016     05/24/2016 [employer] dual chamber ICD      ? NSVT (nonsustained ventricular tachycardia) (HCC) 05/24/2016   ? 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  05/12/1997 - Cardiac Cath: EF 30%. No coronary disease  b. 2004 Echo Dr. Fran Lowes EF 40%, ToprolXL 200 from 1999-2005  c.  2/05 Reenters MAC Switch to Coreg 25 BID, then feels better  d. 7/05 Echo EF 45% LVEDD 5.1 cm  e. 7/06 Echo EF 45% LVEDD 5.6  Aspirin 81/day offered as antiplatelet agent that could possibly offset platelet aggregation effects of Ibu taken for arthritis.   f. 09/03/06  Stress Echo 12.5 min Bruce protocol treadmill: EF 45% w/ improvement w/ exercise  G. 08/17/11 Echo: EF 50%, LVEDD 5.4 LV function appears similar or slightly better than in 2008.   H. 10/13 Reg thall to assess flow and  LV Fx p Deep T inversion on EKGs:  EF by MPI 45%, no ischemia/infarct.  Echo EF 35%:    I 3//2018 Regad T-66m Scan: EF 14% LVEDV 240, No ischemia or infarct. Marked decline in LVEF since prior study.      ? Nonsustained ventricular tachycardia (HCC) 12/18/2008      a.  05/1997 - NonSustained VT noted during hospitalization to evaluate cardiomyopathy.   b.  05/13/1997 - Ventricular stimulation study by Dr. Clint Bolder: no sustained arrhythmias.   c. 05/24/2016 ICD implant w EF decline to 14%     ? Hypercholesterolemia 12/18/2008     a.  08/21/2002 - Total 196, trig 103, HDL 69, LDL 106; no meds  b. 6/06 total 183 trig 63 HDL 52 LDL 100 no meds  c. 10/06 total 144 trig 60 HDL 61 LDLd 61 Lipitor 10, DC'd   d. 08/26/06 total 197 trig 91 HDL 77 LDL 101  E. 03/10/10 lipids with total 233 trig 69 HDL 82 and LDL 120 no statins  F. 02/14/11 Total 234 Trig 104 HDL 80 LDL 132 no meds. 7/13 inf T inversion on EKG, Lipiitor 10 resumed, stress Test>>No ischemia  G 4.2015 Stops Atorvastatin 5 d.t myalgias, Tolerating simvastatin 20 as substituete 12/2013.           Review of Systems   Constitution: Negative.   HENT: Negative.    Eyes: Negative.    Cardiovascular: Negative.    Respiratory: Negative.    Endocrine: Negative.    Hematologic/Lymphatic: Negative.    Skin: Negative.    Musculoskeletal: Positive for arthritis, back pain, joint pain and muscle cramps.   Gastrointestinal: Negative.    Genitourinary: Negative.    Neurological: Negative.    Psychiatric/Behavioral: Negative.    Allergic/Immunologic: Negative.    14 organ system review noted. It is negative except as reported in current narrative or above in the ROS section. This is a patient centered review of systems that was stated by the patient in her terms prior to my personal problem oriented interview with the patient     Physical Exam    General: Patient looks generally healthy.  Appearance consistent with BMI  23 . Wearing medical face mask   Skin warm and dry, non icteric,        Pupils equal and round         Thyroid not enlarged.  Neck veins: CVP 6-8 normal, no V wave, No hepato-jugular reflux   Carotids: no bruits   Chest: Left delto-pectoral electronic pulse generator   Respiratory: Breathing comfortably. Lungs clear to auscultation and percussion.   Cardiac: Regular rhythm.   Fourth heart sound, no rub or third sound.No murmur  Abdomen soft, non-tender, no masses or bruits   Legs/feet: Adequate PT  pulses, no edema.   Motor: Normal muscle strength.      Cognitive: Good insight, clear historian, no depression     Cardiovascular Studies  EKG shows sinus rhythm rate 66. left axis deviation IVCD poor R wave progression with 1 APC.EKG unchanged from prior tracings    Problems Addressed Today  No diagnosis found.    Assessment and Plan     Gloria Houston has unexplained hypotension that is given her the lowest reading in the last 10years on today's clinic visit.  There is nothing to predict or explain why she is relatively hypotensive.  She had significant LV systolic dysfunction on solid medical therapy.  She does not feel bad but as a precaution having her cut her Vasotec back from 20 twice daily to 10 mg twice daily.    1 to get a better understanding of her overall metabolic state so I am checking BNP CBC CMP PND TSH today L although it would not surprise me if everything were satisfactory.  I do not expect the BNP to be elevated.    I want to get an echo in the next month to see if there is been a decline in LV systolic function as deterioration in LV systolic performance in patient with a chronic heart failure could sometimes be found in association with declining blood pressures unstable heart failure meds.    Like to see her back in a month.  I think she would be better served by [facility] then [facility].  With her tendency for hyperkalemia in the past on full dose Aldactone we may have difficulty getting her beyond starting dose Entresto 24/26 but I think that would provide her better protection than the Vasotec alone.    When I see her again March 23 she should have already had the echo done and will have a good idea about her tolerance of the revised medical program.  Total time for this visit including interview exam presentation of information to patient and injury of information into the EMR is 30 to 35 minutes.  NB: The free text in this document was generated through Dragon(TM) software with editing and proofreading  done by the author of this document Dr. Mable Paris MD, [facility] principally at the point of care. Some errors may persist.  If there are questions about content in this document please contact Dr. Hale Bogus.    The written information I provided Ms. Kloster at the conclusion of today's encounter is as  follows:    Patient Instructions   For the borderline low blood pressures that I think may have been symptomatic yesterday everything looks great.  The cause for this is unclear.  Certainly not too hot outside which is of one the most common causes for her low blood pressure and stable patients on heart failure meds.    I think we should check to make sure everything is okay of metabolically with a BNP,  CBC, CMP and the thyroid hormone TSH All of this is probably going to be okay but if I do not check I cannot find one find out that something is unexpectedly wrong.    I think for the next week or so you should cut the Vasotec dose back to 10 twice a day instead of 20 that is the enalapril.  Keep track your blood pressure.  I think episodically you had readings in the 90 range here but none under 90 such as were seen today.  I do not know why  you are not more normal for you in the blood pressure range.    I like to get an echo sometime in the next month and see me the next time I am in [place] to discuss your situation and see how your pressures are running with the half dose Vasotec 10 twice a day.  Think an Sherryll Burger may be helpful for you even though we will probably need to stay with a low-dose because of your blood pressure and not push the dose too high.  I am backing down on your Vasotec to make it easier to shift to the [facility] as I want to have a normal blood pressure as we move toward the Entresto.    I just checked my schedule for March 23.  It is pretty light right now.  I will put you on the schedule in the afternoon and we can discuss Entresto and see how things are looking.  When she to get the lab work I talked about today so I will see those results in decide about whether we need to make any other changes between now and then    Call in if you have problems or questions.   Gloria Nestle, MD                  Current Medications (including today's revisions)  ? ascorbic acid (VITAMIN C) 500 mg PO tablet Take 500 mg by mouth Daily.   ? Aspirin 81 mg PO Tab Take 1 Tab by mouth Daily.   ? CALCIUM CARBONATE/VITAMIN D3 (CALCIUM 500 + D PO) Take 1 tablet by mouth three times daily.   ? carvediloL (COREG) 25 mg tablet Take one tablet by mouth twice daily with meals.   ? cholecalciferol (VITAMIN D-3) 5000 unit tablet Take 5,000 Units by mouth daily.   ? enalapril (VASOTEC) 20 mg tablet Take one tablet by mouth twice daily.   ? ibuprofen (MOTRIN) 200 mg PO tablet Take 3 Tabs by mouth twice daily as needed.   ? LACTOBACILLUS ACIDOPHILUS (FLORAJEN PO) Take  by mouth daily.   ? multivit/folic acid/vit K1 (ONE-A-DAY WOMEN'S 50 PLUS PO) Take 1 tablet by mouth daily.   ? multivitamin (THERAGRAN) PO per tablet Take 1 Tab by mouth Daily.   ? omega 3-dha-epa-fish oil 900-1,400 mg cpDR Take 1 capsule by mouth daily.   ? spironolactone (ALDACTONE) 25 mg tablet Take one-half tablet by mouth daily. Take with food.   ? vitamins, B complex tab Take 1 Tab by mouth daily.   ? vits A,C,E/zinc/copper (VISION-VITE PRESERVE PO) Take  by mouth twice daily.

## 2019-05-06 ENCOUNTER — Encounter: Admit: 2019-05-06 | Discharge: 2019-05-06 | Payer: MEDICARE

## 2019-05-06 DIAGNOSIS — I509 Heart failure, unspecified: Secondary | ICD-10-CM

## 2019-05-06 DIAGNOSIS — I472 Ventricular tachycardia: Secondary | ICD-10-CM

## 2019-05-06 DIAGNOSIS — I10 Essential (primary) hypertension: Secondary | ICD-10-CM

## 2019-05-15 ENCOUNTER — Encounter: Admit: 2019-05-15 | Discharge: 2019-05-15 | Payer: MEDICARE

## 2019-05-15 ENCOUNTER — Ambulatory Visit: Admit: 2019-05-15 | Discharge: 2019-05-15 | Payer: MEDICARE

## 2019-05-15 DIAGNOSIS — I509 Heart failure, unspecified: Secondary | ICD-10-CM

## 2019-05-15 DIAGNOSIS — I472 Ventricular tachycardia: Secondary | ICD-10-CM

## 2019-05-15 MED ORDER — PERFLUTREN LIPID MICROSPHERES 1.1 MG/ML IV SUSP
1-20 mL | Freq: Once | INTRAVENOUS | 0 refills | Status: CP | PRN
Start: 2019-05-15 — End: ?

## 2019-05-21 ENCOUNTER — Ambulatory Visit: Admit: 2019-05-21 | Discharge: 2019-05-21 | Payer: MEDICARE

## 2019-05-26 ENCOUNTER — Encounter: Admit: 2019-05-26 | Discharge: 2019-05-26 | Payer: MEDICARE

## 2019-06-02 ENCOUNTER — Encounter: Admit: 2019-06-02 | Discharge: 2019-06-02 | Payer: MEDICARE

## 2019-06-02 DIAGNOSIS — E7800 Pure hypercholesterolemia, unspecified: Secondary | ICD-10-CM

## 2019-06-02 DIAGNOSIS — I420 Dilated cardiomyopathy: Secondary | ICD-10-CM

## 2019-06-02 DIAGNOSIS — I509 Heart failure, unspecified: Secondary | ICD-10-CM

## 2019-06-02 DIAGNOSIS — I472 Ventricular tachycardia: Secondary | ICD-10-CM

## 2019-06-02 DIAGNOSIS — Z95810 Presence of automatic (implantable) cardiac defibrillator: Secondary | ICD-10-CM

## 2019-06-02 DIAGNOSIS — I428 Other cardiomyopathies: Secondary | ICD-10-CM

## 2019-06-02 NOTE — Progress Notes
Date of Service: 06/02/2019    Gloria Houston is a 69 y.o. female.       HPI     Patient returns for follow-up discussion of her echo findings and her medical therapy.  She is had known LV dysfunction with EF 30% to find a cath in 1999.  This is a familial cardiomyopathy with brother and sister suffering similar problems relatively early in life.  She is now 69 years old with no history of clinical deterioration from the LV dysfunction.  She had ICD implant at my recommendation in 2018 after persisting ejection fraction with readings as low as  14% after initial improvement in EF on medical therapy.  Never had ICD shock.    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  She continues to swim.  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  She is done less during the Covid quarantine but she is never been sedentary.  She is not having palpitations.  She has no symptoms of heart failure no PND orthopnea edema or tachypalpitations.  I talked to her in the past about Entresto and that was what you think she wanted to talk about today was with her medication doses were satisfactory.         Vitals:    06/02/19 1241 06/02/19 1242   BP: 112/68 112/66   BP Source: Arm, Left Upper Arm, Right Upper   Patient Position: Sitting Sitting   Pulse: 81    SpO2: 96%    Weight: 54.7 kg (120 lb 9.6 oz)    Height: 1.6 m (5' 3)    PainSc: Zero      Body mass index is 21.36 kg/m?Marland Kitchen     Past Medical History  Patient Active Problem List    Diagnosis Date Noted   ? 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  05/12/1997 - Cardiac Cath: EF 30%. No coronary disease  b. 2004 Echo Dr. Fran Lowes EF 40%, ToprolXL 200 from 1999-2005  c.  2/05 Reenters MAC Switch to Coreg 25 BID, then feels better  d. 7/05 Echo EF 45% LVEDD 5.1 cm  e. 7/06 Echo EF 45% LVEDD 5.6 Aspirin 81/day offered as antiplatelet agent that could possibly offset platelet aggregation effects of Ibu taken for arthritis.   f. 09/03/06  Stress Echo 12.5 min Bruce protocol treadmill: EF 45% w/ improvement w/ exercise  G. 08/17/11 Echo: EF 50%, LVEDD 5.4 LV function appears similar or slightly better than in 2008.   H. 10/13 Reg thall to assess flow and  LV Fx p Deep T inversion on EKGs:  EF by MPI 45%, no ischemia/infarct.  Echo EF 35%:    I 3//2018 Regad T-54m Scan: EF 14% LVEDV 240, No ischemia or infarct. Marked decline in LVEF since prior study.      ? Hypotension due to drugs 05/05/2019     05/04/2018: Vasotec reduced from 20 twice daily to 10 twice daily without clear-cut reason for hypotension on stable dosing     ? Systolic murmur 09/17/2017      September 17, 2017 new systolic murmur noted likely mitral regurgitation echo Doppler ordered to verify valve function and recheck LV function.     ? [employer] ICD in place 05/25/2016  05/24/16 left   sided DDD ICD placed Dr. Clint Bolder after EF 14%$ on reg thall w no ischemia or infarct.     ? Congestive heart failure, NYHA class 1 and ACC/AHA stage B [facility]) 05/24/2016     05/24/2016 [employer] dual chamber ICD      ? NSVT (nonsustained ventricular tachycardia) (HCC) 05/24/2016   ? Nonsustained ventricular tachycardia (HCC) 12/18/2008      a.  05/1997 - NonSustained VT noted during hospitalization to evaluate cardiomyopathy.   b.  05/13/1997 - Ventricular stimulation study by Dr. Clint Bolder: no sustained arrhythmias.   c. 05/24/2016 ICD implant w EF decline to 14%     ? Hypercholesterolemia 12/18/2008     a.  08/21/2002 - Total 196, trig 103, HDL 69, LDL 106; no meds  b. 6/06 total 183 trig 63 HDL 52 LDL 100 no meds  c. 10/06 total 144 trig 60 HDL 61 LDLd 61 Lipitor 10, DC'd   d. 08/26/06 total 197 trig 91 HDL 77 LDL 101  E. 03/10/10 lipids with total 233 trig 69 HDL 82 and LDL 120 no statins  F. 02/14/11 Total 234 Trig 104 HDL 80 LDL 132 no meds. 7/13 inf T inversion on EKG, Lipiitor 10 resumed, stress Test>>No ischemia  G 4.2015 Stops Atorvastatin 5 d.t myalgias, Tolerating simvastatin 20 as substituete 12/2013.           Review of Systems   Constitution: Negative.   HENT: Negative.    Eyes: Negative.    Cardiovascular: Negative.    Respiratory: Negative.    Endocrine: Negative.    Hematologic/Lymphatic: Negative.    Skin: Negative.    Musculoskeletal: Negative.    Gastrointestinal: Negative.    Genitourinary: Negative.    Neurological: Negative.    Psychiatric/Behavioral: Negative.    Allergic/Immunologic: Negative.    14 organ system review noted. It is negative except as reported in current narrative or above in the ROS section. This is a patient centered review of systems that was stated by the patient in her terms prior to my personal problem oriented interview with the patient     Physical Exam  General: Patient looks generally healthy.  Appearance consistent with BMI  23 . Wearing medical face mask   Skin warm and dry, non icteric,        Pupils equal and round         Thyroid not enlarged.  Neck veins: CVP 6-8 normal, no V wave, No hepato-jugular reflux   Carotids: no bruits   Chest: Left delto-pectoral electronic pulse generator   Respiratory: Breathing comfortably. Lungs clear to auscultation and percussion.   Cardiac: Regular rhythm.   Fourth heart sound, no rub or third sound.No murmur  Abdomen soft, non-tender, no masses or bruits   Legs/feet: Adequate PT pulses, no edema.   Motor: Normal muscle strength.      Cognitive: Good insight, clear historian, no depression     Cardiovascular Studies  EKG today shows sinus rhythm left axis deviation is unchanged from previous to prior studies.    Problems Addressed Today  No diagnosis found.    Assessment and Plan     I went through the questions that [facility] had in writing for me.  Told her that I be finishing my work in our [place] outpatient clinic in December 2021 and I would then be seeing Gloria Duncans patients only if they came to the my [place] clinic.  I did welcome Cristyn to come down to see me  but I suggested that Dr. Erskine Squibb [place] certified heart failure specialist who was coming to [facility]uncans now would be a very highly qualified and gracious alternative cardiologist for her to see in [place].    We had extensive discussion about Entresto.  Its not been shown to have benefits in asymptomatic stage B patients but think he is an exceptional person with her lifetime fitness efforts.  She might have a benefit from [place] but I just cannot project it to her based on data.  As her quality of life is very good and she has no sense of deterioration other than concerns based on fluctuations of her ejection fraction on echo she is comfortable staying with the generic enalapril and getting in touch with [place] immediately if she develops any deterioration.    Her brother is a Interior and spatial designer and still working part-time in Scientific laboratory technician.  I told her that he knows of Sherryll Burger and has a lot of experience in seeing heart failure drugs, on the market.  He could be another source of them for mission for her.    I reviewed her chart thoroughly and noticed that her BNP last month was 52 when we are reassessing her stability and that the only other time we felt the need to check BNP was in July 2019 when it was marginally elevated at 105.  I told her this BNP and normal range is very reassuring regardless of what her ejection fraction looks like.    Finally I discussed the issue that she always has in the back of her mind about whether it safe to exercise.  I told her that her excellent outcome over the years is probably due at least in part to the fact that she is maintained ideal weight and commitment physical fitness.  I cautioned her against doing resistance work where there is more most muscle tension and motion but I think she likes to do particularly swimming are totally favorable for her.    I recommended a follow-up visit with me in 6 months in [place] to make sure she does not have any other questions or issues that I could resolve for her before she transitions her heart failure care to  Dr. Pierre Bali.    The total time I expended today for this encounter for reviewing records,  interviewing patient, doing exam, developing diagnosis, creating treatment plan written in patient oriented  terminology  for the AVS, explaining it to the patient and entering further information in the EMR was 35 min  NB: The free text in this document was generated through Dragon(TM) software with editing and proofreading  done by the author of this document Dr. Mable Paris MD, [facility] principally at the point of care. Some errors may persist.  If there are questions about content in this document please contact Dr. Hale Bogus.    The written information I provided Ms. Wignall at the conclusion of today's encounter is as  follows:    Patient Instructions     Here in the answers to the questions you gave me.    First of all my last [place] visit looks like it will be December 28.  I can see you any fourth Tuesday of the month through that date.    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    Third Dr. Erskine Squibb [place]ington comes to [facility] is a really fine person and a certified  heart failure specialist.  I think she should be the one to see you    Fourth Sherryll Burger helps a lot of people who start taking it even no I did not think that things would change for them very much your exceptional and that you have never been sick from heart failure you are more active than most people your age and feel better but still have a weak heart.  You would be someone whom might never see the difference between [facility] and the enalapril except for the fact that [place] costs a lot more.  I cannot tell how much it is going to cost you for co-pay in advance because everyone's different on their coverage and I just never know what is going to happen.  Occasionally I have people who can get any help on the cost of Entresto through some sort of by down and just totally cannot afford it.    Your brother has seen all sorts of new drugs come on the market and they become gradually less expensive.  Sherryll Burger is unusual because it is gradually looking better and better in outcomes then it looks like it would after the first study got it on the market.    Your blood pressure is not that high so I am not even sure that I get you up to the third dose level of Entresto and you might end up on the lowest dose of the middle dose so it is hard to know what the benefit is when you are taking the lower doses compared to the strongest 1.  You are on the strongest dose of carvedilol 25 mg twice a day you are on half target dose of Vasotec and you were on half the target dose of spironolactone but half a tablet and a full tablet are almost the same effect in my opinion.    The other thing that is really striking about your case is that your BNP heart stress number is 66 which is in the middle of the normal range.  Some studies look at high BNP as an indication for [facility].  Your BNP under 100 is another indication that you may not really be and enough trouble to benefit from the [facility] even though your ejection fraction looks a little worse on the echo.  Your BNP was a tiny bit higher than normal on the first check and now it is lower.  This is really good news for you if you are trying to sort out how serious things are.   Normal  09/17/2017  05/05/2019    BNP Under 100  105 (H) 66     The only physical activity that I would not be fully satisfied that you are doing would be heavy weight lifting.  That kind of thing where you are lifting and there is a lot of muscle tension and tightness and straining overload your heart but any activity where you are muscles are working and moving rather than working and motionless is good for you and will not hurt you.    As we discussed the only thing standing between you and a completely normal bill of health this the fact that your heart looks weak on echo but every other aspect of how well your body works in cooperation with the heart is fine.    Lets have a follow-up visit in [place] in September 6 months from now.  Call in if you have problems or questions.   Marissa Nestle, MD  Current Medications (including today's revisions)  ? ascorbic acid (VITAMIN C) 500 mg PO tablet Take 500 mg by mouth Daily.   ? Aspirin 81 mg PO Tab Take 1 Tab by mouth Daily.   ? CALCIUM CARBONATE/VITAMIN D3 (CALCIUM 500 + D PO) Take 1 tablet by mouth three times daily.   ? carvediloL (COREG) 25 mg tablet Take one tablet by mouth twice daily with meals.   ? cholecalciferol (VITAMIN D-3) 5000 unit tablet Take 5,000 Units by mouth daily.   ? enalapril (VASOTEC) 20 mg tablet Take one tablet by mouth twice daily.   ? ibuprofen (MOTRIN) 200 mg PO tablet Take 3 Tabs by mouth twice daily as needed.   ? LACTOBACILLUS ACIDOPHILUS (FLORAJEN PO) Take  by mouth daily.   ? Magnesium 200 mg tab Take  by mouth.   ? multivit/folic acid/vit K1 (ONE-A-DAY WOMEN'S 50 PLUS PO) Take 1 tablet by mouth daily.   ? multivitamin (THERAGRAN) PO per tablet Take 1 Tab by mouth Daily.   ? omega 3-dha-epa-fish oil 900-1,400 mg cpDR Take 1 capsule by mouth daily.   ? spironolactone (ALDACTONE) 25 mg tablet Take one-half tablet by mouth daily. Take with food.   ? vitamins, B complex tab Take 1 Tab by mouth daily.   ? vits A,C,E/zinc/copper (VISION-VITE PRESERVE PO) Take  by mouth twice daily.

## 2019-08-20 ENCOUNTER — Encounter: Admit: 2019-08-20 | Discharge: 2019-08-20 | Payer: MEDICARE

## 2019-08-27 ENCOUNTER — Encounter: Admit: 2019-08-27 | Discharge: 2019-08-27 | Payer: MEDICARE

## 2019-08-27 ENCOUNTER — Ambulatory Visit: Admit: 2019-08-27 | Discharge: 2019-08-27 | Payer: MEDICARE

## 2019-08-27 DIAGNOSIS — I472 Ventricular tachycardia: Secondary | ICD-10-CM

## 2019-09-17 ENCOUNTER — Encounter: Admit: 2019-09-17 | Discharge: 2019-09-17 | Payer: MEDICARE

## 2019-09-17 MED ORDER — SPIRONOLACTONE 25 MG PO TAB
12.5 mg | ORAL_TABLET | Freq: Every day | ORAL | 2 refills | 46.00000 days | Status: AC
Start: 2019-09-17 — End: ?

## 2019-10-12 ENCOUNTER — Encounter: Admit: 2019-10-12 | Discharge: 2019-10-12 | Payer: MEDICARE

## 2019-11-22 ENCOUNTER — Encounter: Admit: 2019-11-22 | Discharge: 2019-11-22 | Payer: MEDICARE

## 2019-12-08 ENCOUNTER — Encounter: Admit: 2019-12-08 | Discharge: 2019-12-08 | Payer: MEDICARE

## 2019-12-08 DIAGNOSIS — I428 Other cardiomyopathies: Secondary | ICD-10-CM

## 2019-12-08 DIAGNOSIS — I472 Ventricular tachycardia: Secondary | ICD-10-CM

## 2019-12-08 DIAGNOSIS — I420 Dilated cardiomyopathy: Secondary | ICD-10-CM

## 2019-12-08 DIAGNOSIS — I509 Heart failure, unspecified: Secondary | ICD-10-CM

## 2019-12-08 DIAGNOSIS — E7800 Pure hypercholesterolemia, unspecified: Secondary | ICD-10-CM

## 2019-12-08 DIAGNOSIS — Z95810 Presence of automatic (implantable) cardiac defibrillator: Secondary | ICD-10-CM

## 2019-12-08 NOTE — Progress Notes
Date of Service: 12/08/2019    Gloria Houston is a 69 y.o. female.       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 She had ICD implanted several years ago. She had narrow complex QRS and did not need CRT-D. She remains free of symptoms as she virtually always has been. Even though she has had LV dysfunction's known since 1999 it still not clear to me that she should be considered as having stage C heart failure rather than stage B because of not clear she is never really had symptoms. She is not had significant valve disease. She had nonsustained VT during her initial hospitalization in March 1999 for evaluation of LV dysfunction but she is never had symptomatic palpitations or hadICD shock.    Patient returns today reporting that she is still symptom-free tolerating her medications without ill effect and feeling come as a stable it has she has been previously over the last several years. She lives with her brother Fredrik Cove who also has cardiomyopathy and executes activities of daily living without difficulty. With all this history of cardiomyopathy treatment it is extraordinary to hear from Tye that she is doing water aerobics and teaching swimming at the [employer] just that she always has without any deterioration in functional capacity. 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 Hypotension due to drugs 05/05/2019     05/04/2018: Vasotec reduced from 20 twice daily to 10 twice daily without clear-cut reason for hypotension on stable dosing     ? Systolic murmur 09/17/2017      September 17, 2017 new systolic murmur noted likely mitral regurgitation echo Doppler ordered to verify valve function and recheck LV function.     ? [employer] ICD in place 05/25/2016     05/24/16 left   sided DDD ICD placed Dr. Clint Bolder after EF 14%$ on reg thall w no ischemia or infarct.     ? Congestive heart failure, NYHA class 1 and ACC/AHA stage B [facility]) 05/24/2016     05/24/2016 [employer] dual chamber ICD      ? NSVT (nonsustained ventricular tachycardia) (HCC) 05/24/2016   ? 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  05/12/1997 - Cardiac Cath: EF 30%. No coronary disease  b. 2004 Echo Dr. Fran Lowes EF 40%, ToprolXL 200 from 1999-2005  c.  2/05 Reenters MAC Switch to Coreg 25 BID, then feels better  d. 7/05 Echo EF 45% LVEDD 5.1 cm  e. 7/06 Echo EF 45% LVEDD 5.6  Aspirin 81/day offered as antiplatelet agent that could possibly offset platelet aggregation effects of Ibu taken for arthritis.   f. 09/03/06  Stress Echo 12.5 min Bruce protocol treadmill: EF 45% w/ improvement w/ exercise  G. 08/17/11 Echo: EF 50%, LVEDD 5.4 LV function appears similar or slightly better than in 2008.   H. 10/13 Reg thall to assess flow and  LV Fx p Deep T inversion on EKGs:  EF by MPI 45%, no ischemia/infarct.  Echo EF 35%:    I 3//2018 Regad T-17m Scan: EF 14% LVEDV 240, No ischemia or infarct. Marked decline in LVEF since prior study.      ? Nonsustained ventricular tachycardia (HCC) 12/18/2008      a.  05/1997 - NonSustained VT noted during hospitalization to evaluate cardiomyopathy.   b.  05/13/1997 - Ventricular stimulation study by Dr. Clint Bolder: no sustained arrhythmias.   c. 05/24/2016 ICD implant w EF decline to 14%     ? Hypercholesterolemia 12/18/2008     a.  08/21/2002 - Total 196, trig 103, HDL 69, LDL 106; no meds  b. 6/06 total 183 trig 63 HDL 52 LDL 100 no meds  c. 10/06 total 144 trig 60 HDL 61 LDLd 61 Lipitor 10, DC'd   d. 08/26/06 total 197 trig 91 HDL 77 LDL 101  E. 03/10/10 lipids with total 233 trig 69 HDL 82 and LDL 120 no statins  F. 02/14/11 Total 234 Trig 104 HDL 80 LDL 132 no meds. 7/13 inf T inversion on EKG, Lipiitor 10 resumed, stress Test>>No ischemia  G 4.2015 Stops Atorvastatin 5 d.t myalgias, Tolerating simvastatin 20 as substituete 12/2013.           Review of Systems   Constitutional: Negative.   HENT: Negative.    Eyes: Negative.    Cardiovascular: Negative.    Respiratory: Negative.    Endocrine: Negative.    Hematologic/Lymphatic: Negative.    Skin: Negative.    Musculoskeletal: Negative.    Gastrointestinal: Negative.    Genitourinary: Negative.    Neurological: Negative.    Psychiatric/Behavioral: Negative.    Allergic/Immunologic: Negative.    14 organ system review noted. It is negative except as reported in current narrative or above in the ROS section. This is a patient centered review of systems that was stated by the patient in her terms prior to my personal problem oriented interview with the patient     Physical Exam  General: Patient looks generally healthy.  Appearance consistent with BMI  23 . Wearing medical face mask   Skin warm and dry, non icteric,        Pupils equal and round         Thyroid not enlarged.  Neck veins: CVP 6-8 normal, no V wave, No hepato-jugular reflux   Carotids: no bruits   Chest: Left delto-pectoral electronic pulse generator   Respiratory: Breathing comfortably. Lungs clear to auscultation and percussion.   Cardiac: Regular rhythm.   Fourth heart sound, no rub or third sound.No murmur  Abdomen soft, non-tender, no masses or bruits   Legs/feet: Adequate PT pulses, no edema.   Motor: Normal muscle strength.      Cognitive: Good insight, clear historian, no depression     Cardiovascular Studies  EKG today shows atrial pacing rate 60 intact AV conduction with IVCD QRS duration 116 with atypical bundle branch block pattern.    Cardiovascular Health Factors  Vitals BP Readings from Last 3 Encounters:   12/08/19 110/68   06/02/19  112/66   05/15/19 111/64     Wt Readings from Last 3 Encounters:   12/08/19 55.3 kg (122 lb)   06/02/19 54.7 kg (120 lb 9.6 oz)   05/15/19 54.6 kg (120 lb 6.4 oz)     BMI Readings from Last 3 Encounters:   12/08/19 21.61 kg/m?   06/02/19 21.36 kg/m?   05/15/19 21.33 kg/m?      Smoking Social History     Tobacco Use   Smoking Status Never Smoker   Smokeless Tobacco Never Used      Lipid Profile Cholesterol   Date Value Ref Range Status   05/10/2016 198  Final     HDL   Date Value Ref Range Status   05/10/2016 69 (H) 35 - 60 Final     LDL   Date Value Ref Range Status   05/10/2016 107 (H) <100 Final     Triglycerides   Date Value Ref Range Status   05/10/2016 70  Final      Blood Sugar No results found for: HGBA1C  Glucose   Date Value Ref Range Status   05/05/2019 95  Final   09/23/2018 84  Final   04/02/2018 110 (H) 70 - 105 Final          Problems Addressed Today  No diagnosis found.    Assessment and Plan     Gloria Houston remains asymptomatic with LV systolic dysfunction on beta-blocker ACE inhibitor and Aldo blockade.  She has no findings of volume overload or symptoms of low output or congestion.  She has no intolerance to her medications.    I been talking to her about Sherryll Burger for several years.  When she was at [facility] for ICD generator change in 2018 I wanted consideration for initiation of Entresto wants then when we could watch her blood pressures in the hospital.  It was found that for her the co-pay would be $500.  She found this to be a very unattractive price point particularly when she is asymptomatic and the complications of hypotension might be greater with Entresto than her lisinopril.  We have yet to decide to start [facility] for the reasons discussed above.    There is no other intervention I believe it is warranted at this time.  Considerations for starting Jardiance because of LV dysfunction remain the same as those of Entresto related to the fact that she is asymptomatic and to my knowledge there are no trials of starting Jardiance or Entresto in patients who are asymptomatic with no recent heart failure hospitalizations or any indication of near-term complications from heart failure.    She expects to see Dr. Baldo Ash on her next visit as I will not be in [place] after December for clinic here.  I think follow-up with Dr. Pierre Bali 6 months from now would be prudent.  From there it may be that Gloria Squibb decides to go with annual follow-up rather than every 6 months but either way I think Gloria Houston will be in touch with [place] if she has any issues at all.    20 minutes were expended as the total time for this encounter.  The time was spent reviewing records,  interviewing patient, doing exam, developing diagnosis, creating treatment plan written in patient oriented  terminology  for the AVS, explaining it to the patient and entering further information in the EMR.        NB: The free text in this document was generated through Dragon(TM) software with editing and  proofreading  done by the author of this document Dr. Mable Paris MD, [facility] principally at the point of care. Some errors may persist.  If there are questions about content in this document please contact Dr. Hale Bogus.    The written information I provided Gloria Houston at the conclusion of today's encounter is as  follows:    Patient Instructions   Thank you 1 weeks started into treating you for heart failure it would have never crossed my mind to tell you that 20 years after we started your treatment that you would still be wondering if you have ever had a symptom from heart failure.  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    We have repeatedly discussed the idea of adding other medications to give you a better outcome but their cost and possible side effects are difficult to justify when you are asymptomatic and I have not had any emergency room visits or any heart failure hospitalizations except as they relate to updating your ICD generator.    Dr. Erskine Squibb Houston is a heart failure specialist with excellent judgment.  I think she will be a terrific doctor for you.  I am glad for you and others in [place] with weak hearts that she is coming up here to give great care.  I had like you to see her in about 6 months.  I know she will discuss some of these issues with you and perhaps have some different ideas about how your treatment can be improved.  Been a real pleasure taking care of you over the years and I am happy to see you in such good shape as we wrap up your last visit with me.    My Chart is the best way to communicate with [place] but if you prefer, call in if you have problems or questions.   Marissa Nestle, MD              Current Medications (including today's revisions)  ? ascorbic acid (VITAMIN C) 500 mg PO tablet Take 500 mg by mouth Daily.   ? Aspirin 81 mg PO Tab Take 1 Tab by mouth Daily.   ? CALCIUM CARBONATE/VITAMIN D3 (CALCIUM 500 + D PO) Take 1 tablet by mouth three times daily.   ? carvediloL (COREG) 25 mg tablet Take one tablet by mouth twice daily with meals.   ? cholecalciferol (VITAMIN D-3) 5000 unit tablet Take 5,000 Units by mouth daily.   ? enalapril (VASOTEC) 20 mg tablet Take one tablet by mouth twice daily.   ? ibuprofen (MOTRIN) 200 mg PO tablet Take 3 Tabs by mouth twice daily as needed.   ? LACTOBACILLUS ACIDOPHILUS (FLORAJEN PO) Take  by mouth daily.   ? Magnesium 200 mg tab Take  by mouth.   ? multivit/folic acid/vit K1 (ONE-A-DAY WOMEN'S 50 PLUS PO) Take 1 tablet by mouth daily.   ? multivitamin (THERAGRAN) PO per tablet Take 1 Tab by mouth Daily.   ? omega 3-dha-epa-fish oil 900-1,400 mg cpDR Take 1 capsule by mouth daily.   ? spironolactone (ALDACTONE) 25 mg tablet Take one-half tablet by mouth daily. Take with food.   ? vitamins, B complex tab Take 1 Tab by mouth daily.   ? vits A,C,E/zinc/copper (VISION-VITE PRESERVE PO) Take  by mouth twice daily.

## 2019-12-08 NOTE — Patient Instructions
Thank you 1 weeks started into treating you for heart failure it would have never crossed my mind to tell you that 20 years after we started your treatment that you would still be wondering if you have ever had a symptom from heart failure.  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    We have repeatedly discussed the idea of adding other medications to give you a better outcome but their cost and possible side effects are difficult to justify when you are asymptomatic and I have not had any emergency room visits or any heart failure hospitalizations except as they relate to updating your ICD generator.    Dr. Erskine Squibb Titterington is a heart failure specialist with excellent judgment.  I think she will be a terrific doctor for you.  I am glad for you and others in [place] with weak hearts that she is coming up here to give great care.  I had like you to see her in about 6 months.  I know she will discuss some of these issues with you and perhaps have some different ideas about how your treatment can be improved.  Been a real pleasure taking care of you over the years and I am happy to see you in such good shape as we wrap up your last visit with me.    My Chart is the best way to communicate with [place] but if you prefer, call in if you have problems or questions.   Marissa Nestle, MD

## 2020-02-15 ENCOUNTER — Encounter: Admit: 2020-02-15 | Discharge: 2020-02-15 | Payer: MEDICARE

## 2020-02-15 NOTE — Telephone Encounter
-----   Message from Marinda Elk sent at 02/15/2020  9:17 AM CST -----  Regarding: Heart Logic Alert  Heart Logic alert received, is elevated w/ current index 17. Full remote report available w/ further detail- please review and follow up as needed.

## 2020-02-15 NOTE — Telephone Encounter
Heart Logic Index alert received on patient. Week 1 in alert status, will follow up next week.

## 2020-02-18 ENCOUNTER — Encounter: Admit: 2020-02-18 | Discharge: 2020-02-18 | Payer: MEDICARE

## 2020-02-22 ENCOUNTER — Encounter: Admit: 2020-02-22 | Discharge: 2020-02-22 | Payer: MEDICARE

## 2020-02-24 ENCOUNTER — Encounter: Admit: 2020-02-24 | Discharge: 2020-02-24 | Payer: MEDICARE

## 2020-02-24 NOTE — Telephone Encounter
-----   Message from Marinda Elk sent at 02/22/2020 12:46 PM CST -----  Regarding: Heart Logic Alert  Heart Logic alert received on 12/11- current index 20. Full remote report available w/ further detail.

## 2020-02-29 ENCOUNTER — Encounter: Admit: 2020-02-29 | Discharge: 2020-02-29 | Payer: MEDICARE

## 2020-03-01 ENCOUNTER — Encounter: Admit: 2020-03-01 | Discharge: 2020-03-01 | Payer: MEDICARE

## 2020-03-01 NOTE — Telephone Encounter
-----   Message from Terri Maddux sent at 02/29/2020 10:19 AM CST -----  Regarding: LDB Elevated HL index  Remote alert for elevated HL index measuring 17.  All factors are contributing factors except respiratory rate.  Complete report sent to chart for further detail/review.  Follow up as needed.      Thank you,  Terri/Device Team

## 2020-03-07 ENCOUNTER — Encounter: Admit: 2020-03-07 | Discharge: 2020-03-07 | Payer: MEDICARE

## 2020-04-11 ENCOUNTER — Encounter: Admit: 2020-04-11 | Discharge: 2020-04-11 | Payer: MEDICARE

## 2020-04-11 MED ORDER — CARVEDILOL 25 MG PO TAB
ORAL_TABLET | Freq: Two times a day (BID) | 2 refills
Start: 2020-04-11 — End: ?

## 2020-04-11 MED ORDER — ENALAPRIL MALEATE 20 MG PO TAB
ORAL_TABLET | Freq: Two times a day (BID) | ORAL | 3 refills | Status: AC
Start: 2020-04-11 — End: ?

## 2020-04-11 MED ORDER — CARVEDILOL 25 MG PO TAB
25 mg | ORAL_TABLET | Freq: Two times a day (BID) | ORAL | 3 refills | 90.00000 days | Status: AC
Start: 2020-04-11 — End: ?

## 2020-05-20 ENCOUNTER — Encounter: Admit: 2020-05-20 | Discharge: 2020-05-20 | Payer: MEDICARE

## 2020-05-25 ENCOUNTER — Encounter: Admit: 2020-05-25 | Discharge: 2020-05-25 | Payer: MEDICARE

## 2020-05-25 DIAGNOSIS — Z95810 Presence of automatic (implantable) cardiac defibrillator: Secondary | ICD-10-CM

## 2020-05-25 DIAGNOSIS — I472 Ventricular tachycardia: Secondary | ICD-10-CM

## 2020-05-25 DIAGNOSIS — E7800 Pure hypercholesterolemia, unspecified: Secondary | ICD-10-CM

## 2020-05-25 DIAGNOSIS — I420 Dilated cardiomyopathy: Secondary | ICD-10-CM

## 2020-05-25 DIAGNOSIS — I509 Heart failure, unspecified: Secondary | ICD-10-CM

## 2020-05-25 NOTE — Telephone Encounter
-----   Message from Gloria Client, LPN sent at 03/28/3565 11:42 AM CDT -----  Regarding: Device ICD check  Patient is due for annual device ICD check in office in June 2022. Can you please add an order for this?     Thank you!!

## 2020-06-07 ENCOUNTER — Encounter: Admit: 2020-06-07 | Discharge: 2020-06-07 | Payer: MEDICARE

## 2020-06-07 DIAGNOSIS — I472 Ventricular tachycardia: Secondary | ICD-10-CM

## 2020-06-07 DIAGNOSIS — I509 Heart failure, unspecified: Secondary | ICD-10-CM

## 2020-06-07 DIAGNOSIS — I420 Dilated cardiomyopathy: Secondary | ICD-10-CM

## 2020-06-07 DIAGNOSIS — I429 Cardiomyopathy, unspecified: Secondary | ICD-10-CM

## 2020-06-07 DIAGNOSIS — E7800 Pure hypercholesterolemia, unspecified: Secondary | ICD-10-CM

## 2020-06-07 MED ORDER — FARXIGA 10 MG PO TAB
10 mg | ORAL_TABLET | Freq: Every day | ORAL | 3 refills | Status: AC
Start: 2020-06-07 — End: ?

## 2020-06-07 MED ORDER — SPIRONOLACTONE 25 MG PO TAB
12.5 mg | ORAL_TABLET | Freq: Every day | ORAL | 3 refills | 46.00000 days | Status: AC
Start: 2020-06-07 — End: ?

## 2020-06-07 MED ORDER — ROSUVASTATIN 5 MG PO TAB
5 mg | ORAL_TABLET | ORAL | 1 refills | 90.00000 days | Status: AC
Start: 2020-06-07 — End: ?

## 2020-06-07 MED ORDER — ENALAPRIL MALEATE 20 MG PO TAB
20 mg | ORAL_TABLET | Freq: Two times a day (BID) | ORAL | 3 refills | Status: AC
Start: 2020-06-07 — End: ?

## 2020-06-07 MED ORDER — CARVEDILOL 25 MG PO TAB
25 mg | ORAL_TABLET | Freq: Two times a day (BID) | ORAL | 3 refills | 90.00000 days | Status: AC
Start: 2020-06-07 — End: ?

## 2020-06-10 ENCOUNTER — Encounter: Admit: 2020-06-10 | Discharge: 2020-06-10 | Payer: MEDICARE

## 2020-06-10 NOTE — Telephone Encounter
Pt left message stating that due to the cost, she is unwilling to start Farxiga at this time.  Called pt to discuss.  She states that she is feeling really good lately and doesn't feel comfortable starting the new medication.  She states that she has to watch her BP closely already, and would like to reevaluate at her next office visit after she has had her echocardiogram.    Discussed with patient that we could check the price of jardiance and also could send the information to the [place] medication assistance program to help with the cost of medication.  She prefers to keep her medications as is for now.  She will call us back with any change or updates.

## 2020-06-10 NOTE — Telephone Encounter
Titterington, Cherlynn Polo, MD  Rogelia Boga, RN  Caller: Unspecified (Today, 1:17 PM)  Fine of course for her to decline, but she should understand this is not for a blood pressure medication. This newer heart failure medication is designed to preserve heart and kidney function which results in people living longer, feeling better, and staying out of the hospital. Of course the choice is hers and we can revisit next visit.     Called pt to discuss.  She would still like to wait at this point to start the medication.  She states that she just started another medication for cholesterol and feels more comfortable starting one medication at a time.  She appreciates the additional information and will call us if she would like to proceed with the medication.

## 2020-06-13 ENCOUNTER — Encounter: Admit: 2020-06-13 | Discharge: 2020-06-13 | Payer: MEDICARE

## 2020-06-14 ENCOUNTER — Encounter: Admit: 2020-06-14 | Discharge: 2020-06-14 | Payer: MEDICARE

## 2020-06-14 NOTE — Progress Notes
Medication Assistance Program packet has been sent to patient for signatures and other required documents (if applicable) . Once received back, medication assistance coordinator will begin processing     Kaydra Borgen  Medication Assistance Coordinator   [phone]

## 2020-06-20 ENCOUNTER — Encounter: Admit: 2020-06-20 | Discharge: 2020-06-20 | Payer: MEDICARE

## 2020-06-20 NOTE — Telephone Encounter
Per PHI LM on phone # ending in [phone] requesting call back for symptom check since Heart Logic alerted at 16 today. No EP remote tracings scanned into Epic yet. Will wait for those to document findings.

## 2020-06-20 NOTE — Telephone Encounter
-----   Message from Almira Coaster sent at 06/20/2020  8:46 AM CDT -----  Regarding: HeartLogic Alert, [name]  Gloria Houston morning,    This patient had a HeartLogic Alert this morning. HeartLogic HF Index crossed alert threshold with index of 16.  This will alert weekly until the index is below recovery threshold.  Currently the contributing factors are:  S3: 1.42mG   S3/S1 Ratio: 1.42mG /3.14mG   Respiratory Rate: 18rpm  Night Heart Rate: 77bpm  Will continue to scan trends and monitor. Please see attached. Thank you.

## 2020-06-22 ENCOUNTER — Encounter: Admit: 2020-06-22 | Discharge: 2020-06-22 | Payer: MEDICARE

## 2020-06-22 DIAGNOSIS — Z95810 Presence of automatic (implantable) cardiac defibrillator: Secondary | ICD-10-CM

## 2020-06-27 ENCOUNTER — Encounter: Admit: 2020-06-27 | Discharge: 2020-06-27 | Payer: MEDICARE

## 2020-06-27 NOTE — Telephone Encounter
-----   Message from Marinda Elk sent at 06/27/2020  9:29 AM CDT -----  Regarding: Heart Logic Alert  Heart Logic alert received today, index is 24. See full remote report for further detail.

## 2020-07-04 ENCOUNTER — Encounter: Admit: 2020-07-04 | Discharge: 2020-07-04 | Payer: MEDICARE

## 2020-07-04 NOTE — Telephone Encounter
PC to pt for symptom check as HL alert index up.  Pt reports having severe bronchitis with fever and productive cough (phlem brownish/green) X 5 days. She was seen by PCP at walk-in clinic this past weekend and started on Doxycycline twice daily and is now feeling better past 24 hrs.  He cough is more productive and she feels her fever is down but she has not checked it. Pt states she has been sick with upper respiratory infection X3 weeks total. URI started in her sinus and moved to her chest.  She reports finally getting over this.      Pt. denies any edema or increase in her weight but does report she was so short of air with exertion she had to stop while taking 1 flight of stairs up to her 2nd floor in her home.  This DOE has improved since starting the antibiotic approx 48hrs ago. She is instructed to call our office if any increase in her DOE, SOA or any changes/comcerms. Pt is agreeable to plan.     HEART LOGIC ALERT    Received Heart Logic Alert for patient. HL index is 38, up from 24 last week. This is week 3 in alert status. HL will continue to alert weekly until HL index crosses alert recovery threshold of 6.      SOA with activity: Yes  SOA at rest: No  Edema: No  Cough:Yes  Chest pain: No  Palpitations/heart racing: No  PND: Yes- X 5days improving with antibiotics.   Weight gain: No  Dizziness/Ligheadedness: No  Other: Yes- URI x 3 weeks  Recent Fevers: Yes (none past 24hrs)    Date Blood Pressure Weight  (Lbs)   07/04/20 100/50's 120   06/29/20 90's/50's 121   06/27/20 88/58 121     Previous Heart Logic Alerts  (Note: X indicates contributing trends)    Date HL Index S3 S3/S1 Ratio Thoracic Impedance RR Night HR Intervention   07/04/20 38 X X X X X    06/26/20 24 X X X X X    06/19/20 16 X X         Most Recent BMP    Basic Metabolic Profile    Lab Results   Component Value Date/Time    NA 139 04/07/2020 12:00 AM    K 4.3 04/07/2020 12:00 AM    CA 9.4 04/07/2020 12:00 AM    CL 106 04/07/2020 12:00 AM CO2 23.0 04/07/2020 12:00 AM    GAP 14 04/07/2020 12:00 AM    Lab Results   Component Value Date/Time    BUN 25.0 (H) 04/07/2020 12:00 AM    CR 1.01 04/07/2020 12:00 AM    GLU 92 04/07/2020 12:00 AM        Medications  Reviewed the following medications with patient. Patient reports taking medications as prescribed and has not missed any doses.    Home Medications    Medication Sig   ascorbic acid (VITAMIN C) 500 mg PO tablet Take 500 mg by mouth Daily.   Aspirin 81 mg PO Tab Take 1 Tab by mouth Daily.   CALCIUM CARBONATE/VITAMIN D3 (CALCIUM 500 + D PO) Take 1 tablet by mouth three times daily.   carvediloL (COREG) 25 mg tablet Take one tablet by mouth twice daily with meals.   cholecalciferol (VITAMIN D-3) 5000 unit tablet Take 5,000 Units by mouth daily.   empagliflozin (JARDIANCE) 10 mg tablet Take one tablet by mouth daily.   enalapril maleate (VASOTEC)  5 mg tablet Take one tablet by mouth twice daily. 06/28/2020: Per Dr. Pierre Bali, Hold Enalapril until sinus infection improved and BP greater than 90/55. Then Start Enalapril 5mg  twice a day.   ibuprofen (MOTRIN) 200 mg PO tablet Take 2-4 tablets by mouth as Needed.   LACTOBACILLUS ACIDOPHILUS (FLORAJEN PO) Take 1 tablet by mouth daily.   Magnesium 200 mg tab Take 1 tablet by mouth daily.   multivit/folic acid/vit K1 (ONE-A-DAY WOMEN'S 50 PLUS PO) Take 1 tablet by mouth daily.   omega 3-dha-epa-fish oil 900-1,400 mg cpDR Take 1 capsule by mouth daily.   rosuvastatin (CRESTOR) 5 mg tablet Take one tablet by mouth as directed. Start 3 nights a week and let us know how you tolerate   spironolactone (ALDACTONE) 25 mg tablet Take one-half tablet by mouth daily. Take with food.   vit C/E/Zn/coppr/lutein/zeaxan (PRESERVISION AREDS-2 PO) Take 1 tablet by mouth twice daily.   vitamins, B complex tab Take 1 Tab by mouth daily.       Upcoming Appointments:    Future Appointments   Date Time Provider Department Center   09/01/2020 11:15 AM [facility]

## 2020-07-04 NOTE — Telephone Encounter
-----   Message from Cheri Guppy, RN sent at 07/04/2020  8:11 AM CDT -----  Regarding: Heart logic now 38- was 24 last week. Please review with CBP team.  3rd week of alerts.    TY

## 2020-07-07 ENCOUNTER — Inpatient Hospital Stay: Admit: 2020-07-07 | Discharge: 2020-07-07 | Payer: MEDICARE

## 2020-07-07 ENCOUNTER — Encounter: Admit: 2020-07-07 | Discharge: 2020-07-07 | Payer: MEDICARE

## 2020-07-07 DIAGNOSIS — I5023 Acute on chronic systolic (congestive) heart failure: Secondary | ICD-10-CM

## 2020-07-07 MED ORDER — ENOXAPARIN 40 MG/0.4 ML SC SYRG
40 mg | Freq: Every day | SUBCUTANEOUS | 0 refills | Status: AC
Start: 2020-07-07 — End: ?

## 2020-07-07 MED ORDER — CALCIUM CARBONATE-VITAMIN D3 500 MG-5 MCG (200 UNIT) PO TAB
1 | Freq: Three times a day (TID) | ORAL | 0 refills | Status: AC
Start: 2020-07-07 — End: ?
  Administered 2020-07-08 – 2020-07-10 (×7): 1 via ORAL

## 2020-07-07 MED ORDER — MULTIVIT-MIN-FA-LUT-ZEAXANTH 100-1.66-0.83 MCG-MG-MG PO TBEC
1 | Freq: Every day | ORAL | 0 refills | Status: AC
Start: 2020-07-07 — End: ?

## 2020-07-07 MED ORDER — CHOLECALCIFEROL (VITAMIN D3) 125 MCG (5,000 UNIT) PO TAB
5000 [IU] | Freq: Every day | ORAL | 0 refills | Status: AC
Start: 2020-07-07 — End: ?
  Administered 2020-07-08 – 2020-07-10 (×3): 5000 [IU] via ORAL

## 2020-07-07 MED ORDER — MAGNESIUM OXIDE 400 MG (241.3 MG MAGNESIUM) PO TAB
400 mg | Freq: Two times a day (BID) | ORAL | 0 refills | Status: AC
Start: 2020-07-07 — End: ?
  Administered 2020-07-08 – 2020-07-10 (×5): 400 mg via ORAL

## 2020-07-07 MED ORDER — ROSUVASTATIN 5 MG PO TAB
5 mg | ORAL | 0 refills | Status: DC
Start: 2020-07-07 — End: 2020-07-08

## 2020-07-07 MED ORDER — ASCORBIC ACID (VITAMIN C) 500 MG PO TAB
500 mg | Freq: Every day | ORAL | 0 refills | Status: AC
Start: 2020-07-07 — End: ?
  Administered 2020-07-08 – 2020-07-10 (×3): 500 mg via ORAL

## 2020-07-07 MED ORDER — ROSUVASTATIN 5 MG PO TAB
5 mg | ORAL | 0 refills | Status: AC
Start: 2020-07-07 — End: ?
  Administered 2020-07-08 – 2020-07-10 (×2): 5 mg via ORAL

## 2020-07-07 MED ORDER — ASPIRIN 81 MG PO TBEC
81 mg | Freq: Every day | ORAL | 0 refills | Status: AC
Start: 2020-07-07 — End: ?
  Administered 2020-07-08 – 2020-07-10 (×3): 81 mg via ORAL

## 2020-07-07 MED ORDER — CARVEDILOL 25 MG PO TAB
25 mg | Freq: Two times a day (BID) | ORAL | 0 refills | Status: AC
Start: 2020-07-07 — End: ?
  Administered 2020-07-08 – 2020-07-09 (×4): 25 mg via ORAL

## 2020-07-07 MED ORDER — OMEGA 3-DHA-EPA-FISH OIL 300-1,000 MG PO CPDR
1000 mg | Freq: Every day | ORAL | 0 refills | Status: AC
Start: 2020-07-07 — End: ?
  Administered 2020-07-08 – 2020-07-10 (×3): 1000 mg via ORAL

## 2020-07-07 NOTE — Telephone Encounter
Patient called nursing line with concerns of shortness of breath, cough with sputum production and pedal edema. Patient states she started a few days ago with sinus congestion and felt it moved to her lungs. She called her pcp office and they referred her to a walkin clinic. She was given antibiotics for bronchitis/URI. Patient states she is having same symptoms with no improvement after a few days of antibiotics. Patient reports this morning having slight pedal edema but weight has been staying 120-121 and blood pressures 90's/50's as her norm.     Patient is going to call her pcp office for further evaluation/treatment of URI/bronchitis and call back if not able to get assistance there.    Will also route this to JST for her review/recommendations.

## 2020-07-08 ENCOUNTER — Encounter: Admit: 2020-07-08 | Discharge: 2020-07-08 | Payer: MEDICARE

## 2020-07-08 ENCOUNTER — Inpatient Hospital Stay: Admit: 2020-07-08 | Discharge: 2020-07-08 | Payer: MEDICARE

## 2020-07-08 ENCOUNTER — Inpatient Hospital Stay: Admit: 2020-07-08 | Payer: MEDICARE

## 2020-07-08 DIAGNOSIS — I429 Cardiomyopathy, unspecified: Secondary | ICD-10-CM

## 2020-07-08 DIAGNOSIS — E7800 Pure hypercholesterolemia, unspecified: Secondary | ICD-10-CM

## 2020-07-08 DIAGNOSIS — I472 Ventricular tachycardia: Secondary | ICD-10-CM

## 2020-07-08 MED ADMIN — MAGNESIUM SULFATE IN D5W 1 GRAM/100 ML IV PGBK [166578]: 1 g | INTRAVENOUS | @ 18:00:00 | Stop: 2020-07-08 | NDC 00338170940

## 2020-07-08 MED ADMIN — MULTIVITAMIN PO TAB [37167]: 1 | ORAL | @ 18:00:00 | NDC 00904053061

## 2020-07-08 MED ADMIN — FUROSEMIDE 10 MG/ML IJ SOLN [3291]: 40 mg | INTRAVENOUS | @ 18:00:00 | Stop: 2020-07-08 | NDC 70860030242

## 2020-07-08 MED ADMIN — MAGNESIUM SULFATE IN D5W 1 GRAM/100 ML IV PGBK [166578]: 1 g | INTRAVENOUS | @ 11:00:00 | Stop: 2020-07-08 | NDC 00338170940

## 2020-07-08 MED ADMIN — PERFLUTREN LIPID MICROSPHERES 1.1 MG/ML IV SUSP [79178]: 2 mL | INTRAVENOUS | @ 17:00:00 | Stop: 2020-07-08 | NDC 11994001116

## 2020-07-08 MED ADMIN — SPIRONOLACTONE 25 MG PO TAB [7437]: 12.5 mg | ORAL | @ 18:00:00 | NDC 63739054410

## 2020-07-08 MED ADMIN — ASPIRIN 81 MG PO TBEC [14113]: 243 mg | ORAL | @ 18:00:00 | Stop: 2020-07-08 | NDC 00904675180

## 2020-07-08 MED ADMIN — MAGNESIUM SULFATE IN D5W 1 GRAM/100 ML IV PGBK [166578]: 1 g | INTRAVENOUS | @ 14:00:00 | Stop: 2020-07-08 | NDC 00338170940

## 2020-07-08 NOTE — H&P (View-Only)
Heart Failure H&P Note    NAME:Gloria Houston                                                                   MRN: [id number]                 DOB:1950/11/03          AGE: 70 y.o.  ADMISSION DATE: 07/07/2020             DAYS ADMITTED: LOS: 0 days      Chief Complaint:  Evaluation and recommendations re: heart failure.        History of Present Illness: Gloria Houston is a 70 y.o. 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  She currently complains of shortness of breath, dyspnea on exertion, orthopnea and peripheral edema. She currently denies lightheadedness, near syncope, palpitations, chest pain and weight change. She does not follow a fluid restriction but has just recently started weighing herself daily. She weighs around 120 pounds which is her baseline, dry weight. She reports 100% compliance with her medications. She does not take any diuretics at home. She was instructed by her cardiologist to hold her enalapril due to acute illness and hypotension.  Of note, she has suffered from an URI over the last several weeks which she believes moved into her lungs recently as she suffered from cough productive of yellow/brown/green sputum for 1.5 weeks. This has gotten much better recently. She tested negative for COVID at OSH.   She states she received a dose of diuretic at the OSH but is unsure of the dose and did not arrive with any paperwork. She has been urinating quite a bit since then per her report.   Principal Problem:    Acute on chronic clinical systolic heart failure [facility])  Active Problems:    Familial dilated cardiomyopathy (HCC)    Hypercholesterolemia    [employer] Dual chamber ICD        Plan:  Today:   > Follow up I/Os and creatinine to assess need for additional diuresis  > BNP  > CXR  > TTE  > EKG  > hold spiro and enalapril pending results of labs  > continue PTA aspirin, statin, coreg and dapagliflozin    Ongoing:  1. BMP once a day. Magnesium level daily.  Keep Potassium greater than 4.0 and Magnesium greater than 2.0.  2. 2000mg  sodium dietary restriction.  3. Fluid Restriction:1.5  4. Strict I/O. Goal output: net neg 2L/24 hour  5. Follow up appointment with a member of the HF team or PCP within 7 calendar days of discharge.    Patient examined and discussed with Dr. Dewaine Conger who discussed with on call staff.    Assessment   Acute on chronic systolic HFrEF,  EF: 25%.  Major Complications or Comorbidities [facility]): acute/ acute on chronic systolic and/or diastolic heart failure  NYHA functional class II (slight limitation of physical activity, comfortable at rest, but ordinary physical activity results in symptoms of HF e.g., walking or climbing stairs),   ACC Stage C (structural heart disease with prior or current symptoms of HF)  Admission BNP:    Goal Output: 2 L/24hr  Intake/Output:     Goal Dry Weight: 120   Admission )Weight: 54.5 kg (120 lb 3.2 oz)   Most recent weights (inpatient):   Vitals:    07/07/20 2021   Weight: 54.5 kg (120 lb 3.2 oz)       Diuretic Therapy n/a   Prior to admission dose n/a   Given on admission n/a   Daily Dosing n/a     GDMT PTA Name & Dose Changes   HF approved beta blockers: Yes (Carvedilol)    ACE/ARB/ Angiotensin II Receptor Blocker Neprilysin Inhibitor: Yes Held for hypotension and renal function   Aldosterone Antagonist: Yes Held for renal function   Isordil/hydralazine: No (Not receiving therapeutic doses of ACE-i/ARB and BB)    Ivabradine: No; NYHA class I or IV and Not treated with maximally tolerated dose beta blockers or beta blockers contraindicated    HRM Device Therapy: Yes (ICD)    Anticoagulation for current or history of atrial fibrillation/flutter: n/a      Fluids, Electrolytes, Nutrition: sodium and fluid restriction  Prophylaxis: lovenox  Code: full, need to re-evaluate  Disposition: admit to HF service on tele    Review of Systems:  A comprehensive review of systems was negative except for: Cardiovascular: positive for orthopnea, exertional chest pressure/discomfort, lower extremity edema    Medical History:   Diagnosis Date   ? Hypercholesterolemia 12/18/2008   ? 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 Nonsustained ventricular tachycardia (HCC) 12/18/2008     Surgical History:   Procedure Laterality Date   ? INSERTION IMPLANTABLE DEFIBRILLATOR SYSTEM AND LEADS - DUAL Left 05/24/2016    Performed by Miles Costain, MD at [facility] EP LAB   ? Injection Venography Extremity Left 05/24/2016    Performed by Miles Costain, MD at [facility] EP LAB   ? Fluoroscopy - Cardiac  05/24/2016    Performed by Miles Costain, MD at [facility] EP LAB     Family History   Problem Relation Age of Onset   ? Heart Disease Mother    ? Stroke Mother    ? Heart Surgery Mother    ? Heart Surgery Father    ? Heart Disease Father    ? Heart Disease Brother    ? Heart Surgery Brother      Social History     Socioeconomic History   ? Marital status: Single     Spouse name: Not on file   ? Number of children: Not on file   ? Years of education: Not on file   ? Highest education level: Not on file   Occupational History   ? Not on file   Tobacco Use   ? Smoking status: Never Smoker   ? Smokeless tobacco: Never Used   Substance and Sexual Activity   ? Alcohol use: No   ? Drug use: No   ? Sexual activity: Not on file   Other Topics Concern   ? Not on file   Social History Narrative   ? Not on file              Objective:    Allergies: No Known Allergies     Medications:  Scheduled Meds:Continuous Infusions:  PRN and Respiratory Meds:    Medications Prior to Admission   Medication Sig Dispense Refill Last Dose   ? ascorbic acid (VITAMIN C) 500 mg PO tablet Take 500 mg by mouth Daily.      ? Aspirin 81 mg PO Tab  Take 1 Tab by mouth Daily.      ? CALCIUM CARBONATE/VITAMIN D3 (CALCIUM 500 + D PO) Take 1 tablet by mouth three times daily.      ? carvediloL (COREG) 25 mg tablet Take one tablet by mouth twice daily with meals. 180 tablet 3    ? cholecalciferol (VITAMIN D-3) 5000 unit tablet Take 5,000 Units by mouth daily.      ? empagliflozin (JARDIANCE) 10 mg tablet Take one tablet by mouth daily. 90 tablet 3    ? enalapril maleate (VASOTEC) 5 mg tablet Take one tablet by mouth twice daily. 06/28/2020: Per Dr. Pierre Bali, Hold Enalapril until sinus infection improved and BP greater than 90/55. Then Start Enalapril 5mg  twice a day. 180 tablet 1    ? ibuprofen (MOTRIN) 200 mg PO tablet Take 2-4 tablets by mouth as Needed.      ? LACTOBACILLUS ACIDOPHILUS (FLORAJEN PO) Take 1 tablet by mouth daily.      ? Magnesium 200 mg tab Take 1 tablet by mouth daily.      ? multivit/folic acid/vit K1 (ONE-A-DAY WOMEN'S 50 PLUS PO) Take 1 tablet by mouth daily.      ? omega 3-dha-epa-fish oil 900-1,400 mg cpDR Take 1 capsule by mouth daily.      ? rosuvastatin (CRESTOR) 5 mg tablet Take one tablet by mouth as directed. Start 3 nights a week and let us know how you tolerate 90 tablet 1    ? spironolactone (ALDACTONE) 25 mg tablet Take one-half tablet by mouth daily. Take with food. 45 tablet 3    ? vit C/E/Zn/coppr/lutein/zeaxan (PRESERVISION AREDS-2 PO) Take 1 tablet by mouth twice daily.      ? vitamins, B complex tab Take 1 Tab by mouth daily.                                Vital Signs:  Last Filed                Vital Signs: 24 Hour Range   BP: 120/68 (04/28 2229)  Temp: 36.3 ?C (97.3 ?F) (04/28 2229)  Pulse: 88 (04/28 2229)  Respirations: 18 PER MINUTE (04/28 2229)  SpO2: 96 % (04/28 2229)  O2 Device: None (Room air) (04/28 2229)  Height: 160 cm (5' 2.99) (04/28 2021)  BP: (118-120)/(62-68)   Temp:  [36.3 ?C (97.3 ?F)]   Pulse:  [66-88]   Respirations:  [18 PER MINUTE]   SpO2:  [96 %-100 %]   O2 Device: None (Room air)           Wt Readings from Last 10 Encounters:   07/07/20 54.5 kg (120 lb 3.2 oz)   06/07/20 55.3 kg (122 lb)   12/08/19 55.3 kg (122 lb)   06/02/19 54.7 kg (120 lb 9.6 oz)   05/15/19 54.6 kg (120 lb 6.4 oz) 05/05/19 55 kg (121 lb 3.2 oz)   09/23/18 54 kg (119 lb)   03/25/18 54 kg (119 lb)   09/24/17 53.1 kg (117 lb)   09/17/17 53.1 kg (117 lb)       Physical Exam:    General Appearance: no distress, thin  Skin: warm and dry  Lips & Oral Mucosa: no pallor or cyanosis   Digits and Nails: normal color, smooth symmetric nails and digits  Eyes: conjunctivae and lids normal  Auscultation/Percussion: breathing comfortably, lungs clear to auscultation, no rales or rhonchi,  no wheezing  Cardiac  Auscultation: Regular rhythm, S1, S2, no S3 or S4, no murmur  Radial Arteries: normal symmetric radial pulses  Pedal Pulses: pulses 2+, symmetric  Lower Extremity Edema: 2+ bilaterally   Abdominal Exam: soft, non-tender, bowel sounds normal, no hepatomegaly  Gait & Station: normal balance and gait  Muscle Strength: normal strength and tone  Orientation: clear historian, good insight    Laboratory Review:   CBC w diff    Lab Results   Component Value Date/Time    WBC 4.7 (L) 04/07/2020 12:00 AM    RBC 4.44 04/07/2020 12:00 AM    HGB 13.3 04/07/2020 12:00 AM    HCT 40.5 04/07/2020 12:00 AM    MCV 91.2 04/07/2020 12:00 AM    MCH 29.9 04/07/2020 12:00 AM    MCHC 32.8 04/07/2020 12:00 AM    RDW 12.7 04/07/2020 12:00 AM    PLTCT 203 04/07/2020 12:00 AM    MPV 9.1 05/25/2016 05:00 AM    No results found for: NEUT, ANC, LYMA, ALC, MONA, AMC, EOSA, AEC, BASA, ABC      Chemistry    Lab Results   Component Value Date/Time    NA 139 04/07/2020 12:00 AM    K 4.3 04/07/2020 12:00 AM    CL 106 04/07/2020 12:00 AM    CO2 23.0 04/07/2020 12:00 AM    GAP 14 04/07/2020 12:00 AM    BUN 25.0 (H) 04/07/2020 12:00 AM    CR 1.01 04/07/2020 12:00 AM    GLU 92 04/07/2020 12:00 AM    Lab Results   Component Value Date/Time    CA 9.4 04/07/2020 12:00 AM    ALBUMIN 4.1 05/05/2019 12:00 AM    TOTPROT 6.8 05/05/2019 12:00 AM    ALKPHOS 79 05/05/2019 12:00 AM    AST 20 05/05/2019 12:00 AM    ALT 14 05/05/2019 12:00 AM    TOTBILI 0.43 05/05/2019 12:00 AM    GFR 57.4 (L) 04/07/2020 12:00 AM    GFRAA >60 05/25/2016 05:00 AM            Renal Function    Lab Results   Component Value Date/Time    NA 139 04/07/2020 12:00 AM    K 4.3 04/07/2020 12:00 AM    CL 106 04/07/2020 12:00 AM    CO2 23.0 04/07/2020 12:00 AM    GAP 14 04/07/2020 12:00 AM    BUN 25.0 (H) 04/07/2020 12:00 AM    BUN 43.0 (H) 05/05/2019 12:00 AM    BUN 31.0 (H) 09/23/2018 12:00 AM    Lab Results   Component Value Date/Time    CR 1.01 04/07/2020 12:00 AM    CR 1.22 (H) 05/05/2019 12:00 AM    CR 1.13 (H) 09/23/2018 12:00 AM    GLU 92 04/07/2020 12:00 AM    CA 9.4 04/07/2020 12:00 AM    ALBUMIN 4.1 05/05/2019 12:00 AM        Lipid Profile INR   Lab Results   Component Value Date    CHOL 183 04/07/2020    TRIG 65 04/07/2020    HDL 65 04/07/2020    LDL 105 (H) 04/07/2020    VLDL 13 04/07/2020    CHOLHDLC 3 04/07/2020         No results found for: INR       Chest X-Ray: pending     Tele/ECG: pending  Echocardiogram Details:   Echo Results  (Last 3 results in the past 3 years)    Echo EF LVIDD  LA Size IVS LVPW Rest PAP    (05/15/19)   25    (05/15/19)   5.50    (05/15/19)   3.50    (05/15/19)   1.10    (05/15/19)   0.90    (05/15/19)   19       (09/24/17)   30    (09/24/17)   5.7    (09/24/17)   3.3    (09/24/17)   0.8    (09/24/17)   1.0    (09/24/17)   17

## 2020-07-09 ENCOUNTER — Inpatient Hospital Stay: Admit: 2020-07-09 | Discharge: 2020-07-09 | Payer: MEDICARE

## 2020-07-09 MED ADMIN — POTASSIUM CHLORIDE IN WATER 10 MEQ/50 ML IV PGBK [11075]: 10 meq | INTRAVENOUS | @ 17:00:00 | Stop: 2020-07-09 | NDC 00338070541

## 2020-07-09 MED ADMIN — SODIUM CHLORIDE 0.9 % IV SOLP [27838]: 300 mg | INTRAVENOUS | @ 15:00:00 | Stop: 2020-07-12 | NDC 00338004902

## 2020-07-09 MED ADMIN — FUROSEMIDE 10 MG/ML IJ SOLN [3291]: 40 mg | INTRAVENOUS | @ 22:00:00 | NDC 70860030242

## 2020-07-09 MED ADMIN — IRON SUCROSE 100 MG IRON/5 ML IV SOLN [80854]: 300 mg | INTRAVENOUS | @ 15:00:00 | Stop: 2020-07-12 | NDC 00517234001

## 2020-07-09 MED ADMIN — POTASSIUM CHLORIDE IN WATER 10 MEQ/50 ML IV PGBK [11075]: 10 meq | INTRAVENOUS | @ 14:00:00 | Stop: 2020-07-09 | NDC 00338070541

## 2020-07-09 MED ADMIN — FUROSEMIDE 10 MG/ML IJ SOLN [3291]: 40 mg | INTRAVENOUS | @ 15:00:00 | NDC 70860030242

## 2020-07-09 MED ADMIN — POTASSIUM CHLORIDE 20 MEQ PO TBTQ [35943]: 40 meq | ORAL | @ 11:00:00 | Stop: 2020-07-09 | NDC 00904708661

## 2020-07-09 MED ADMIN — POTASSIUM CHLORIDE 20 MEQ PO TBTQ [35943]: 40 meq | ORAL | @ 13:00:00 | Stop: 2020-07-09 | NDC 00904708661

## 2020-07-09 MED ADMIN — MULTIVITAMIN PO TAB [37167]: 1 | ORAL | @ 13:00:00 | NDC 00904053061

## 2020-07-09 MED ADMIN — SPIRONOLACTONE 25 MG PO TAB [7437]: 12.5 mg | ORAL | @ 13:00:00 | Stop: 2020-07-09 | NDC 63739054410

## 2020-07-09 MED ADMIN — DAPAGLIFLOZIN 10 MG PO TAB [320172]: 10 mg | ORAL | @ 16:00:00 | NDC 00310621030

## 2020-07-10 ENCOUNTER — Encounter: Admit: 2020-07-10 | Discharge: 2020-07-10 | Payer: MEDICARE

## 2020-07-10 DIAGNOSIS — I472 Ventricular tachycardia: Secondary | ICD-10-CM

## 2020-07-10 DIAGNOSIS — E7800 Pure hypercholesterolemia, unspecified: Secondary | ICD-10-CM

## 2020-07-10 DIAGNOSIS — I420 Dilated cardiomyopathy: Secondary | ICD-10-CM

## 2020-07-10 DIAGNOSIS — I429 Cardiomyopathy, unspecified: Secondary | ICD-10-CM

## 2020-07-10 MED ADMIN — IRON SUCROSE 100 MG IRON/5 ML IV SOLN [80854]: 300 mg | INTRAVENOUS | @ 15:00:00 | Stop: 2020-07-10 | NDC 00517234001

## 2020-07-10 MED ADMIN — TORSEMIDE 20 MG PO TAB [18293]: 20 mg | ORAL | @ 15:00:00 | Stop: 2020-07-10 | NDC 68084053911

## 2020-07-10 MED ADMIN — SODIUM CHLORIDE 0.9 % IV SOLP [27838]: 300 mg | INTRAVENOUS | @ 15:00:00 | Stop: 2020-07-10 | NDC 00338004902

## 2020-07-10 MED ADMIN — SPIRONOLACTONE 25 MG PO TAB [7437]: 25 mg | ORAL | @ 14:00:00 | Stop: 2020-07-10 | NDC 63739054410

## 2020-07-10 MED ADMIN — MULTIVITAMIN PO TAB [37167]: 1 | ORAL | @ 14:00:00 | Stop: 2020-07-10 | NDC 00904053061

## 2020-07-10 MED ADMIN — DAPAGLIFLOZIN 10 MG PO TAB [320172]: 10 mg | ORAL | @ 14:00:00 | Stop: 2020-07-10 | NDC 00310621030

## 2020-07-10 MED ADMIN — METOPROLOL SUCCINATE 100 MG PO TB24 [78380]: 100 mg | ORAL | @ 14:00:00 | Stop: 2020-07-10 | NDC 00904632461

## 2020-07-11 ENCOUNTER — Encounter: Admit: 2020-07-11 | Discharge: 2020-07-11 | Payer: MEDICARE

## 2020-07-11 DIAGNOSIS — I420 Dilated cardiomyopathy: Secondary | ICD-10-CM

## 2020-07-11 DIAGNOSIS — I5023 Acute on chronic systolic (congestive) heart failure: Secondary | ICD-10-CM

## 2020-07-11 MED ORDER — ALUMINUM-MAGNESIUM HYDROXIDE 200-200 MG/5 ML PO SUSP
30 mL | ORAL | 0 refills | PRN
Start: 2020-07-11 — End: ?

## 2020-07-11 MED ORDER — DOCUSATE SODIUM 100 MG PO CAP
100 mg | Freq: Every day | ORAL | 0 refills | PRN
Start: 2020-07-11 — End: ?

## 2020-07-11 MED ORDER — NITROGLYCERIN 0.4 MG SL SUBL
.4 mg | SUBLINGUAL | 0 refills | PRN
Start: 2020-07-11 — End: ?

## 2020-07-11 MED ORDER — ACETAMINOPHEN 325 MG PO TAB
650 mg | ORAL | 0 refills | PRN
Start: 2020-07-11 — End: ?

## 2020-07-11 MED ORDER — TEMAZEPAM 15 MG PO CAP
15 mg | Freq: Every evening | ORAL | 0 refills | PRN
Start: 2020-07-11 — End: ?

## 2020-07-11 NOTE — Telephone Encounter
-----   Message from Prudencio Burly, MD sent at 07/10/2020 10:55 AM CDT -----  Can we do Tele Visit in one week? She is from [place]. Can we get CMP and BNP before Tele Visit?     She is high risk and I want to perform CPET and RHC on the same day in the next 3-4 weeks at [facility]? I would like an ECHO at the same time. It would be ideal if we could get all three on the same day, if not, she can get ECHO locally with images sent to [place] in that time frame.     Thanks!     Gloria Houston

## 2020-07-11 NOTE — Telephone Encounter
Called and LM on pt's non-secure VM requesting call back to discuss orders from [facility] as documented below.    Called PFT lab to review availability for CPET to coordinate with RHC same day; only date in the next month with 8am appt available is 5/25; this would allow for pt to proceed for RHC apprx 11am arrival time will be requested. Cathh lab confirms RHC availability on 5/25.    Scheduled CPET for 08/03/20; confirmed no COVID pre-testing required.     Will await pt call back to confirm dates for CPET, RHC, and if Echo available to follow RHC.

## 2020-07-11 NOTE — Telephone Encounter
Returned call and LM x 2; will go ahead and enter orders for RHC for 5/25 to follow scheduled CPET and Echo.    Will continue to try and f/u with pt to confirm dates, lab request, etc.

## 2020-07-11 NOTE — Telephone Encounter
Reached pt; reviewed plan from [facility]MS with pt; she agrees with 5/25 date for CPET and RHC and hopefully able to coordinate Echo same day.    She has orders from dc team for BMP lab she planned to do on Wed, May 4th 8am. She will make sure additional lab orders were received from [facility] for CMP and BNP. I will fax orders to [facility] on [address] in [place] in am.    She is aware of her f/u on 5/6 4pm telehealth with KR, NP; she has already checked on her MyChart and ZOOM and she is ready.    I will call her tomorrow to confirm lab orders sent and review CPET and Heart Cath instructions. Confirm if Echo can be done on same day as RHC/CPET or arrange to be done at [facility]. Message sent to Echo supervisors; note in RHC orders requesting prior to or following RHC.     Routed for further f/u to [facility] team.

## 2020-07-11 NOTE — Telephone Encounter
Pt was inpatient at [facility] over weekend for heart failure. Pt is scheduled to see Shon Baton, APRN 07/15/2020 and Dr. Pierre Bali 08/04/20. AHF Nurse LM for pt to call to confirm scheduling of RHC, CPET and echo for 08/03/20.  See telephone enc for details.       HEART LOGIC ALERT    Received Heart Logic Alert for patient. HL index is 75, up  from 38 last week. This is week 4 in alert status. HL will continue to alert weekly until HL index crosses alert recovery threshold of 6.           Most Recent BMP    Basic Metabolic Profile    Lab Results   Component Value Date/Time    NA 140 07/10/2020 07:04 AM    K 4.1 07/10/2020 07:04 AM    CA 9.2 07/10/2020 07:04 AM    CL 97 (L) 07/10/2020 07:04 AM    CO2 30 07/10/2020 07:04 AM    GAP 13 (H) 07/10/2020 07:04 AM    Lab Results   Component Value Date/Time    BUN 40 (H) 07/10/2020 07:04 AM    CR 1.30 (H) 07/10/2020 07:04 AM    GLU 111 (H) 07/10/2020 07:04 AM        Cardiac Medications:    Home Medications    Medication Sig   Aspirin 81 mg PO Tab Take 1 Tab by mouth Daily.   empagliflozin (JARDIANCE) 10 mg tablet Take one tablet by mouth daily.   enalapril maleate (VASOTEC) 5 mg tablet Take one tablet by mouth twice daily. 07/10/2020 HOLD this medication until you follow up in 1 week. Check your blood pressure (BP) at home and tell the provider your BP at that appointment, ask if you should restart it.   Magnesium 200 mg tab Take 1 tablet by mouth daily.   metoprolol XL (TOPROL XL) 100 mg extended release tablet Take one tablet by mouth daily.   rosuvastatin (CRESTOR) 5 mg tablet Take one tablet by mouth as directed. Start 3 nights a week and let us know how you tolerate   spironolactone (ALDACTONE) 25 mg tablet Take one tablet by mouth twice daily. Take with food.   torsemide (DEMADEX) 20 mg tablet Take one tablet by mouth daily. Weight yourself when you get home on 07/10/2020. This is your DRY WEIGHT. Take the torsemide once a day. If your weight decreases 5lbs BELOW your DRY WEIGHT, then STOP the torsemide and let your cardiologist know.       Upcoming Appointments:    Future Appointments   Date Time Provider Department Center   07/15/2020  4:00 PM Jiles Crocker [id number] CVM Exam   08/03/2020  8:00 AM [facility] [id number] None   08/04/2020  2:00 PM [place], Cherlynn Polo, MD [facility] CVM Exam

## 2020-07-13 ENCOUNTER — Encounter: Admit: 2020-07-13 | Discharge: 2020-07-13 | Payer: MEDICARE

## 2020-07-13 DIAGNOSIS — I5023 Acute on chronic systolic (congestive) heart failure: Secondary | ICD-10-CM

## 2020-07-13 DIAGNOSIS — I420 Dilated cardiomyopathy: Secondary | ICD-10-CM

## 2020-07-13 LAB — BNP (B-TYPE NATRIURETIC PEPTI): BNP: 267 FL — ABNORMAL HIGH (ref 0–100)

## 2020-07-13 LAB — COMPREHENSIVE METABOLIC PANEL
ALBUMIN: 3.8 % (ref 3.4–4.8)
ALK PHOSPHATASE: 115 % (ref 40–150)
ALT: 33 K/UL — ABNORMAL LOW (ref 0–55)
ANION GAP: 1.3 K/UL (ref 1.0–2.0)
AST: 23 K/UL — ABNORMAL LOW (ref 5–34)
BLD UREA NITROGEN: 38 % — ABNORMAL HIGH (ref 9.8–20.1)
CALCIUM: 9.4 % (ref 8.4–10.2)
GFR ESTIMATED: 39 K/UL — ABNORMAL LOW (ref >59)
GLUCOSE,PANEL: 115 FL — ABNORMAL HIGH (ref 40–150)
SODIUM: 138 % — ABNORMAL LOW (ref 136–145)
TOTAL BILIRUBIN: 1.4 % — ABNORMAL HIGH (ref 0.2–1.2)
TOTAL PROTEIN: 6.8 % (ref 6.2–8.1)

## 2020-07-13 NOTE — Telephone Encounter
Pt States she had wt loss and is now at dry wt was advised to hold for tomorrow address at OV with np at 5/6.

## 2020-07-14 ENCOUNTER — Encounter: Admit: 2020-07-14 | Discharge: 2020-07-14 | Payer: MEDICARE

## 2020-07-14 DIAGNOSIS — I5023 Acute on chronic systolic (congestive) heart failure: Secondary | ICD-10-CM

## 2020-07-14 DIAGNOSIS — I420 Dilated cardiomyopathy: Secondary | ICD-10-CM

## 2020-07-14 NOTE — Progress Notes
Medicare is listed as patient's primary insurance coverage.  Pre-certification is not required for hospitalizations.

## 2020-07-14 NOTE — Patient Instructions
CARDIAC CATHETERIZATION   PRE-ADMISSION INSTRUCTIONS    Patient Name: Gloria Houston  MRN#: [id number]  Date of Birth: November 09, 1950 (70 y.o.)  Today's Date: 07/14/2020    PROCEDURE:  You are scheduled for a Right Heart Pressures Evaluation with Dr. Harley Alto.    PROCEDURE DATE AND ARRIVAL TIME:  Your procedure date is 08/03/20.  You will receive a call from the Cath lab staff between 8:00 a.m. and noon on the business day prior to your procedure to let you know at what time to arrive on the day of your procedure.  WE have requested late arrival time 11am or after so you can keep appointments for:  CPET(Cardiopulmonary Exercise Test) 8am- see information below re: CPET   Echo 10am    Please check in at the Admitting Desk in the Heart Hospital for your procedure. [facility] Entrance and and take a right. Continue down the hallway past the Cardiovascular Medicine office. That hall will take you into the [facility]. Check in at the desk on the left side.)     (If you have further questions regarding your arrival time for the CV lab, please call [phone] by 3:00pm the day before your procedure. Please leave a message with your name and number, your call will be returned in a timely manner.)    PRE-PROCEDURE APPOINTMENTS:    07/15/20    Office visit to update history and physical (requirement within 30 days of procedure)  with Gypsy Balsam, NP at Cardiovascular Medicine Telehealth/[facility]   Reschedule in clinic OV if needed     apprx. 5/11-5/23   Pre-Admission lab work required within 14 days of procedure: BMP and [facility]- orders faxed 07/14/20           FOOD AND DRINK INSTRUCTIONS  Nothing to eat after midnight before your procedure. No caffeine for 24 hours prior to your procedure. You will be under moderate sedation for your procedure.  You may drink clear liquids up to an hour before hospital arrival. This will be confirmed by the Cath lab staff the day before your procedure. SPECIAL MEDICATION INSTRUCTIONS  Any new prescriptions will be sent to your pharmacy listed on file with [place].       Diuretics: Aldactone (spironolactone) -- hold the morning of your procedure.  and Demadex (torsemide) -- hold the morning of your procedure.      HOLD ALL erectile dysfunction medications for 3 days, unless prescribed for pulmonary hypertension.  HOLD ALL over the counter vitamins or supplements on the morning of your procedure.      Additional Instructions  If you wear CPAP, please bring your mask and machine with you to the hospital.    Take a bath or shower with anti-bacterial soap the evening before, or the morning of the procedure.     Bring photo ID and your health insurance card(s).    Arrange for a driver to take you home from the hospital. Please arrange for a friend or family member to take you home from this test. You cannot take a Taxi, [employer] Goad, or public transportation as there has to be a responsible person to help care for you after sedation    Bring an accurate list of your current medications with you to the hospital (all medications and supplements taken daily).  Please use the medication list below and write in the date and time when you took your last dose before your procedure. Update this list of medications as  needed.      Wear comfortable clothes and don't bring valuables, other than photo identification card, with you to the hospital.    Please pack a bag for an overnight stay.     Please review your pre-procedure instructions and bring them with you on the day of your procedure.  Call the office at [phone] with any questions. You may ask to speak with Dr. Vanetta Shawl nurse.        ALLERGIES  No Known Allergies    CURRENT MEDICATIONS  Outpatient Encounter Medications as of 07/14/2020   Medication Sig Dispense Refill   ? ascorbic acid (VITAMIN C) 500 mg PO tablet Take 500 mg by mouth Daily.     ? Aspirin 81 mg PO Tab Take 1 Tab by mouth Daily.     ? CALCIUM CARBONATE/VITAMIN D3 (CALCIUM 500 + D PO) Take 1 tablet by mouth three times daily.     ? cholecalciferol (VITAMIN D-3) 5000 unit tablet Take 5,000 Units by mouth daily.     ? empagliflozin (JARDIANCE) 10 mg tablet Take one tablet by mouth daily. 90 tablet 3   ? enalapril maleate (VASOTEC) 5 mg tablet Take one tablet by mouth twice daily. 07/10/2020 HOLD this medication until you follow up in 1 week. Check your blood pressure (BP) at home and tell the provider your BP at that appointment, ask if you should restart it. 180 tablet 1   ? LACTOBACILLUS ACIDOPHILUS (FLORAJEN PO) Take 1 tablet by mouth daily.     ? Magnesium 200 mg tab Take 1 tablet by mouth daily.     ? metoprolol XL (TOPROL XL) 100 mg extended release tablet Take one tablet by mouth daily. 90 tablet 3   ? multivit/folic acid/vit K1 (ONE-A-DAY WOMEN'S 50 PLUS PO) Take 1 tablet by mouth daily.     ? omega 3-dha-epa-fish oil 900-1,400 mg cpDR Take 1 capsule by mouth daily.     ? rosuvastatin (CRESTOR) 5 mg tablet Take one tablet by mouth as directed. Start 3 nights a week and let us know how you tolerate 90 tablet 1   ? spironolactone (ALDACTONE) 25 mg tablet Take one tablet by mouth twice daily. Take with food. 90 tablet 0   ? torsemide (DEMADEX) 20 mg tablet Take one tablet by mouth daily. Weight yourself when you get home on 07/10/2020. This is your DRY WEIGHT. Take the torsemide once a day. If your weight decreases 5lbs BELOW your DRY WEIGHT, then STOP the torsemide and let your cardiologist know. 30 tablet 0   ? vit C/E/Zn/coppr/lutein/zeaxan (PRESERVISION AREDS-2 PO) Take 1 tablet by mouth twice daily.     ? vitamins, B complex tab Take 1 Tab by mouth daily.       No facility-administered encounter medications on file as of 07/14/2020.       The 'cardio pulmonary exercise stress test(CPET)' will help give [place] a baseline of your heart and lung functions and  your exercise capacity.   This test will be about 2 hours long and you will be on a treadmill or exercise bike. You do not need to hold your medications or fast before (although I do not recommend a heavy meal prior). Wear comfortable clothing and tennis shoes.     The pulmonary lab is located in the medical office building (address below), which is part of the main hospital campus. The closest parking is the [employer]. To get to the pulmonary lab I recommend proceeding in  the main doors of the medical office building and going to the information desk. They will point you down the correct hallway to the pulmonary lab.     Address: [address] [place], [place] [address]     _________________________________________  Form completed by: Guy Franco  Date completed: 07/14/20  Method: Via telephone and mailed to the patient.

## 2020-07-15 ENCOUNTER — Ambulatory Visit: Admit: 2020-07-15 | Discharge: 2020-07-16 | Payer: MEDICARE

## 2020-07-15 ENCOUNTER — Encounter: Admit: 2020-07-15 | Discharge: 2020-07-15 | Payer: MEDICARE

## 2020-07-15 DIAGNOSIS — I5023 Acute on chronic systolic (congestive) heart failure: Secondary | ICD-10-CM

## 2020-07-15 DIAGNOSIS — I472 Ventricular tachycardia: Secondary | ICD-10-CM

## 2020-07-15 DIAGNOSIS — I952 Hypotension due to drugs: Secondary | ICD-10-CM

## 2020-07-15 DIAGNOSIS — E7800 Pure hypercholesterolemia, unspecified: Secondary | ICD-10-CM

## 2020-07-15 DIAGNOSIS — I429 Cardiomyopathy, unspecified: Secondary | ICD-10-CM

## 2020-07-15 DIAGNOSIS — Z95810 Presence of automatic (implantable) cardiac defibrillator: Secondary | ICD-10-CM

## 2020-07-15 DIAGNOSIS — I420 Dilated cardiomyopathy: Secondary | ICD-10-CM

## 2020-07-15 DIAGNOSIS — R011 Cardiac murmur, unspecified: Secondary | ICD-10-CM

## 2020-07-15 MED ORDER — TORSEMIDE 20 MG PO TAB
20 mg | ORAL_TABLET | ORAL | 0 refills | 67.50000 days | Status: AC
Start: 2020-07-15 — End: ?

## 2020-07-15 NOTE — Progress Notes
Date of Service: 07/15/2020    Obtained patient's verbal consent to treat them and their agreement to [facility] financial policy and NPP via this telehealth visit during the [facility] Emergency.    This video home visit was conducted via communication between the patient and physician/provider due to COVID-19.  We have found that certain health care needs can be provided without an in-person visit.. This service lets [place] provide the care patients' need.  If a prescription is necessary we can send it directly to their pharmacy.  If testing is necessary this can be arranged.    Gloria Houston is a 70 y.o. female.  She follows with Dr. Pierre Bali.  She has a follow-up appointment with Dr. Pierre Bali on 08/04/2020.    HPI     Gloria Houston is here today for post hospital follow-up.  She was admitted on 07/07/2020 and discharged on 07/10/2020 for acute on chronic heart failure.  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    She was admitted as a transfer from outside hospital due to DOE, shortness of breath, lower extremity edema and worsening cardiac function.  She received IV diuretics there.  She underwent right heart catheterization on 07/10/2020 that showed RA 5 RV 38/2 PA 38/20 (26) PCWP 13 V wave 15 PA sat 60% RA sat 50% arterial sat 88% LVEDP 12 CO/CI by Fick 3.1/2 CO/CI by TD 2.65/1.71.  She also underwent left heart catheterization that showed no significant disease in left main and LAD large high takeoff diagonal with 50% stenosis otherwise mild disease left circumflex with mild disease RCA with mild disease throughout.    On discharge her medications were as follows Toprol-XL 100 mg in place of Coreg, spironolactone which was increased to 25 bid, torsemide 20 mg p.o. daily.  She was to hold enalapril until follow-up visit and labs.  Plan was for telehealth with me today and follow-up with Dr. Pierre Bali the end of May.  She did undergo labs locally on 07/13/2020 which showed sodium 138 potassium 3.7 bicarb 29 BUN 38 creatinine 1.41 (BUN 40 and creatinine 1.3 on discharge) her AST was 23 a LT 33 and alk phos 115.  Her BNP was 2672.  She called Dr. Darcus Austin office and stated she was at her dry weight so Dr. Pierre Bali instructed her to hold her torsemide 20 mg daily until today's visit.  Per Dr. Vanetta Shawl note inpatient her dry weight is around 120 pounds.    Plan is for her to come back to [place] in the next 3 to 4 weeks for CPET and echocardiogram.  She was deemed high risk of needing advanced therapies if her EF did not improve.    Gloria Houston is very delightful and here by herself today for follow-up visit.  She reports doing well since she got home.  Her weight on day of discharge was 114 pounds.  This continued to decrease 109 pounds.  She held her torsemide because she was instructed to hold it if she decreased 5 pounds.  Her last dose was May 3.  Her weight continues to consistently be 109 pounds.  She denies shortness of breath at rest or with exertion.  With stairs she still has some shortness of breath.  She denies chest pain, heart palpitations, lower extremity edema, abdominal distention.  Her blood pressures have been 90s over 60s consistently since she  got home.    Medication reconciliation was performed and she is taking all medications as prescribed.  We discussed that with medications such as spironolactone and Jardiance that are new this could potentially be controlling her volume and reducing need for loop diuretics.  We came up with a plan to continue to hold torsemide over the weekend.  If weights increase over the weekend she would start torsemide 20 mg Monday Wednesday Friday and do another telehealth appointment a week from today Friday the 13th to assess tolerance.  If her weights do not deviate from 109 pounds up then she will continue to hold torsemide until her follow-up appointment.          Telehealth Patient Reported Vitals     Row Name 07/15/20 1541 07/15/20 1540             BP: ? 98/64       BP Source: ? Arm, Left Upper       BP Position: ? Sitting       BP Method: ? Automatic/Digital Reading       Pulse: ? 92       Weight: ? 49.4 kg (109 lb)       Height: ? 160 cm (5' 3)       Pain Score: ? Zero       Pain Score Zero ?                   Body mass index is 19.31 kg/m?Marland Kitchen     Past Medical History  Patient Active Problem List    Diagnosis Date Noted   ? Acute on chronic clinical systolic heart failure (HCC) 07/07/2020     07/08/20 Echo   The left ventricle is moderately dilated. Mild eccentric hypertrophy. The left ventricular systolic function is severely reduced. The visually estimated ejection fraction is 10%. The ejection fraction by Simpson's biplane method is 15%. There are segmental wall motion abnormalities, as coded in the diagram below. Abnormal septal motion consistent with right ventricular pacing. No thrombus present. Unable to assess left ventricular diastolic function. Unable to assess left atrial pressure.   Right Ventricle The right ventricular size is normal. The right ventricular systolic function is probably normal. The pulmonary artery pressure could not be estimated due to inadequate tricuspid regurgitation signal. Pacemaker lead present in the ventricle.   Left Atrium Severely dilated.   Right Atrium Normal size. Pacemaker lead present in the right atrium.   IVC/SVC Elevated central venous pressure (5-10 mm Hg).   Mitral Valve Non-specific thickening. No stenosis. Moderate regurgitation. The jet impinges on the atrial wall.   Tricuspid Valve Normal valve structure. No stenosis. Trace regurgitation.   Aortic Valve The valve has focal thickening and is sclerotic. Tricuspid.  No stenosis. No regurgitation.   Pulmonary The pulmonic valve was not seen well but no Doppler evidence of stenosis. No stenosis. Trace regurgitation.   Aorta The aortic root and ascending aorta are normal in size.   Pericardium Small circumferential pericardial effusion. Bilateral Pleural Effusions.          ? Hypotension due to drugs 05/05/2019     05/04/2018: Vasotec reduced from 20 twice daily to 10 twice daily without clear-cut reason for hypotension on stable dosing     ? Systolic murmur 09/17/2017      September 17, 2017 new systolic murmur noted likely mitral regurgitation echo Doppler ordered to verify valve function and recheck LV function.     ?  [employer] Dual chamber ICD  05/25/2016     05/24/16 left   sided DDD ICD placed Dr. Clint Bolder after EF 14%$ on reg thall w no ischemia or infarct.     ? Congestive heart failure, NYHA class 1 and ACC/AHA stage B [facility]) 05/24/2016     05/24/2016 [employer] dual chamber ICD      ? NSVT (nonsustained ventricular tachycardia) (HCC) 05/24/2016   ? 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  05/12/1997 - Cardiac Cath: EF 30%. No coronary disease  b. 2004 Echo Dr. Fran Lowes EF 40%, ToprolXL 200 from 1999-2005  c.  2/05 Reenters MAC Switch to Coreg 25 BID, then feels better  d. 7/05 Echo EF 45% LVEDD 5.1 cm  e. 7/06 Echo EF 45% LVEDD 5.6  Aspirin 81/day offered as antiplatelet agent that could possibly offset platelet aggregation effects of Ibu taken for arthritis.   f. 09/03/06  Stress Echo 12.5 min Bruce protocol treadmill: EF 45% w/ improvement w/ exercise  G. 08/17/11 Echo: EF 50%, LVEDD 5.4 LV function appears similar or slightly better than in 2008.   H. 10/13 Reg thall to assess flow and  LV Fx p Deep T inversion on EKGs:  EF by MPI 45%, no ischemia/infarct.  Echo EF 35%:    I 3//2018 Regad T-57m Scan: EF 14% LVEDV 240, No ischemia or infarct. Marked decline in LVEF since prior study.      ? Nonsustained ventricular tachycardia (HCC) 12/18/2008      a.  05/1997 - NonSustained VT noted during hospitalization to evaluate cardiomyopathy.   b. 05/13/1997 - Ventricular stimulation study by Dr. Clint Bolder: no sustained arrhythmias.   c. 05/24/2016 ICD implant w EF decline to 14%     ? Hypercholesterolemia 12/18/2008     a.  08/21/2002 - Total 196, trig 103, HDL 69, LDL 106; no meds  b. 6/06 total 183 trig 63 HDL 52 LDL 100 no meds  c. 10/06 total 144 trig 60 HDL 61 LDLd 61 Lipitor 10, DC'd   d. 08/26/06 total 197 trig 91 HDL 77 LDL 101  E. 03/10/10 lipids with total 233 trig 69 HDL 82 and LDL 120 no statins  F. 02/14/11 Total 234 Trig 104 HDL 80 LDL 132 no meds. 7/13 inf T inversion on EKG, Lipiitor 10 resumed, stress Test>>No ischemia  G 4.2015 Stops Atorvastatin 5 d.t myalgias, Tolerating simvastatin 20 as substituete 12/2013.           ROS  *As above in HPI    Physical exam limited d/t video tele health visit:  General Appearance: patient in no apparent distress.  Skin: pink, no jaundice, exposed skin is intact  Eyes: conjunctivae and lids normal, pupils are equal and round   Lips: no pallor or cyanosis   Neck Veins: VESSELS: JVP is not elevated above the sternal notch    Respiratory Effort: breathing comfortably, no respiratory distress   Lower extremities: No edema  Orientation: oriented to time, place and person   PSYCH: Appropriate   Language and Memory: patient responsive and seems to comprehend information   NEURO: Alert and conversant    Cardiovascular Studies  As above in HPI    Problems Addressed Today  Encounter Diagnoses   Name Primary?   ? NSVT (nonsustained ventricular tachycardia) (HCC) Yes   ? Nonsustained ventricular tachycardia (HCC)    ? Systolic murmur    ? Hypotension due to drugs    ?  Familial dilated cardiomyopathy (HCC)    ? [employer] Dual chamber ICD     ? Acute on chronic clinical systolic heart failure (HCC)          Assessment/Plan:   Heart failure with reduced EF:  The most recent echo showed an EF of 15 % on 07/08/2020.  The patient is C (structural heart disease with prior or current symptoms of HF) and describes HF Functional Class NYHA III (marked limitation of physical activity - comfortable at rest, but less than ordinary activity causes symptoms of HF e.g., getting dressed or standing from a sitting position) symptoms.    -Torsemide 20 mg p.o. daily (held, last dose 07/12/2020)  -If weight trends up over weekend (currently 109 pounds) plan will be torsemide 20 mg Monday Wednesday Friday.  If weight remains stable at 109 pounds plan will be torsemide as needed.    Non-ischemic presumed familial cardiomyopathy:    She will need genetic testing    GDMT Current Dose Changes made at visit   BB  Toprol-XL 100 daily    ACEI/ARB/ARNI  instructed to hold enalapril on discharge  continue to hold BP 90s/60   SGLT-2 Inhibitor  continue Jardiance 10 mg daily    Aldosterone Antagonist  continue spironolactone 25 mg twice daily    Hydralazine/Nitrate  N/A    Ivabradine  N/A    Cardiac Rehab (Y/N)  consider      ICD in situ/history of [id number]:[id number] recent device interrogation 07/11/2020.  A pacing 12% of the time RV pacing 0% of the time otherwise a sensed V sensed sinus rhythm in the 90s with 1% AT/AF burden. *Beta-blocker as above    CAD: the patient is not describing any anginal type symptoms on aspirin 81 mg and Crestor 5 daily    CKD: the most recent creatinine was 1.41 on 07/13/2020.    Follow up: With Fara Boros, NP 07/22/20 at 9:00 a.m.  CPET and Echo 5/25.  F/u with Dr. Pierre Bali 08/04/20.  We will have her get labs on Jul 20, 2020 so Albin Felling has results for her visit on May 13  *I suspect since goal-directed medical therapy is new to Gloria Houston that her volume status may be able to Euvolemic on SGLT2 and spironolactone.  However, we will do check in in a week to ensure that volume is staying off and determine plan with loop diuretic.    I have personally documented the HPI, physical exam and medical decision making.  Counseling and education: I reviewed recent lab results and trends, current medications, necessary changes in treatment, discussed heart failure signs & symptoms, and importance of logging daily weights in addition to maintaining a low sodium diet & fluid restriction.  Plan of care was discussed and patient verbalized.  Please see AVS for teaching and instructions specific to this visit. Patient advised to call our office if she has any questions, clarification needed, worsening symptoms, or concerns prior to the next appointment.       Thank you for allowing me to participate in the care of this patient. If you have any questions please do not hesitate to contact our office.    Shon Baton, APRN  [facility]  Phone: [phone] Fax: [phone]  Pager: [phone]       I spent 40 minutes on this encounter today including time for clinic prep/chart review, performing a medically appropriate examination and/or evaluation, and documentation in the electronic  health record.  Total counseling time with the patient today was approximately 20-25 minutes discussing her heart disease.  We reviewed the above treatment plan, went over risks/benefits/alternatives to the therapies, and all questions were answered to her satisfaction.             Current Medications (including today's revisions)  ? ascorbic acid (VITAMIN C) 500 mg PO tablet Take 500 mg by mouth Daily.   ? Aspirin 81 mg PO Tab Take 1 Tab by mouth Daily.   ? CALCIUM CARBONATE/VITAMIN D3 (CALCIUM 500 + D PO) Take 1 tablet by mouth three times daily.   ? cholecalciferol (VITAMIN D-3) 5000 unit tablet Take 5,000 Units by mouth daily.   ? empagliflozin (JARDIANCE) 10 mg tablet Take one tablet by mouth daily.   ? enalapril maleate (VASOTEC) 5 mg tablet Take one tablet by mouth twice daily. 07/10/2020 HOLD this medication until you follow up in 1 week. Check your blood pressure (BP) at home and tell the provider your BP at that appointment, ask if you should restart it.   ? LACTOBACILLUS ACIDOPHILUS (FLORAJEN PO) Take 1 tablet by mouth daily.   ? Magnesium 200 mg tab Take 1 tablet by mouth daily.   ? metoprolol XL (TOPROL XL) 100 mg extended release tablet Take one tablet by mouth daily.   ? multivit/folic acid/vit K1 (ONE-A-DAY WOMEN'S 50 PLUS PO) Take 1 tablet by mouth daily.   ? omega 3-dha-epa-fish oil 900-1,400 mg cpDR Take 1 capsule by mouth daily.   ? rosuvastatin (CRESTOR) 5 mg tablet Take one tablet by mouth as directed. Start 3 nights a week and let us know how you tolerate   ? spironolactone (ALDACTONE) 25 mg tablet Take one tablet by mouth twice daily. Take with food.   ? torsemide (DEMADEX) 20 mg tablet Take one tablet by mouth daily. Weight yourself when you get home on 07/10/2020. This is your DRY WEIGHT. Take the torsemide once a day. If your weight decreases 5lbs BELOW your DRY WEIGHT, then STOP the torsemide and let your cardiologist know.   ? vit C/E/Zn/coppr/lutein/zeaxan (PRESERVISION AREDS-2 PO) Take 1 tablet by mouth twice daily.   ? vitamins, B complex tab Take 1 Tab by mouth daily.

## 2020-07-18 ENCOUNTER — Encounter: Admit: 2020-07-18 | Discharge: 2020-07-18 | Payer: MEDICARE

## 2020-07-19 ENCOUNTER — Encounter: Admit: 2020-07-19 | Discharge: 2020-07-19 | Payer: MEDICARE

## 2020-07-19 ENCOUNTER — Inpatient Hospital Stay: Admit: 2020-07-19 | Discharge: 2020-07-19 | Payer: MEDICARE

## 2020-07-19 DIAGNOSIS — I5023 Acute on chronic systolic (congestive) heart failure: Secondary | ICD-10-CM

## 2020-07-19 LAB — COMPREHENSIVE METABOLIC PANEL
ALBUMIN: 3.9 g/dL (ref 3.5–5.0)
ALT: 23 U/L (ref 7–56)
AST: 28 U/L (ref 7–40)
BLD UREA NITROGEN: 41 mg/dL — ABNORMAL HIGH (ref 7–25)
CALCIUM: 9.1 mg/dL (ref 8.5–10.6)
CO2: 24 MMOL/L — ABNORMAL HIGH (ref 21–30)
CREATININE: 1.4 mg/dL — ABNORMAL HIGH (ref 0.4–1.00)
EGFR: 38 mL/min — ABNORMAL LOW (ref >60)
SODIUM: 135 MMOL/L — ABNORMAL LOW (ref 137–147)

## 2020-07-19 LAB — CBC AND DIFF
ABSOLUTE BASO COUNT: 0.1 K/UL (ref 0–0.20)
ABSOLUTE MONO COUNT: 0.7 K/UL — ABNORMAL HIGH (ref 0–0.80)
EOSINOPHILS %: 0 % (ref 0–5)
HEMOGLOBIN: 14 g/dL (ref 12.0–15.0)
LYMPHOCYTES %: 16 % — ABNORMAL LOW (ref 24–44)
MCH: 30 pg — ABNORMAL LOW (ref 26–34)
MCHC: 32 g/dL — ABNORMAL HIGH (ref 32.0–36.0)
NEUTROPHILS %: 77 % (ref 41–77)
RBC COUNT: 4.6 M/UL (ref 4.0–5.0)
WBC COUNT: 12 K/UL — ABNORMAL HIGH (ref 4.5–11.0)

## 2020-07-19 LAB — MAGNESIUM: MAGNESIUM: 2.2 mg/dL (ref 1.6–2.6)

## 2020-07-19 MED ORDER — ROSUVASTATIN 10 MG PO TAB
5 mg | ORAL | 0 refills | Status: AC
Start: 2020-07-19 — End: ?
  Administered 2020-07-20 – 2020-07-25 (×3): 5 mg via ORAL

## 2020-07-19 MED ORDER — METOPROLOL SUCCINATE 100 MG PO TB24
100 mg | Freq: Every day | ORAL | 0 refills | Status: AC
Start: 2020-07-19 — End: ?

## 2020-07-19 MED ORDER — SPIRONOLACTONE 25 MG PO TAB
25 mg | Freq: Two times a day (BID) | ORAL | 0 refills | Status: AC
Start: 2020-07-19 — End: ?
  Administered 2020-07-20 – 2020-07-22 (×6): 25 mg via ORAL

## 2020-07-19 MED ORDER — DAPAGLIFLOZIN 10 MG PO TAB
10 mg | Freq: Every day | ORAL | 3 refills | Status: AC
Start: 2020-07-19 — End: ?

## 2020-07-19 MED ORDER — ENALAPRIL MALEATE 5 MG PO TAB
5 mg | Freq: Two times a day (BID) | ORAL | 0 refills | Status: AC
Start: 2020-07-19 — End: ?

## 2020-07-19 MED ORDER — ASPIRIN 81 MG PO TBEC
81 mg | Freq: Every day | ORAL | 0 refills | Status: AC
Start: 2020-07-19 — End: ?
  Administered 2020-07-20 – 2020-07-26 (×7): 81 mg via ORAL

## 2020-07-19 NOTE — Telephone Encounter
-----   Message from Gordy Savers sent at 07/18/2020  9:15 AM CDT -----  Regarding: Heart Logic alert. ALso 3 brief AF/AFL episodes.  A Yellow Alert was reported by the device:    HeartLogic Heart Failure Index remains above the alert recovery threshold of 6.  HeartLogic is at 51. Contributing factors are S3, S3/S1 Ratio, Respiratory Rate and Night Heart Rate.    Additional Notes:  Normal device function. 3 ATR mode switch episodes, longest 3 seconds. Egms showed AF/AFL. No history of AF/AFL and no OAC on med list. Even though the AF/AFL episode were very brief, I decided to flag for them since no history of AF/AFL.

## 2020-07-19 NOTE — Telephone Encounter
Spoke with Jeraldine about Kate's recommendation to be further assessed by the [facility] or local ER. Dion is responsive to this request and will work on finding a ride to assist her. She was unsure which location she will be going to at this time, depends on her ride. She is appreciative of the call and agrees with getting assessed. I gave her the on call number to call later if she has further questions. Told her I will follow up with her tomorrow to see where she ended up going and what they treated.

## 2020-07-19 NOTE — Telephone Encounter
Received device alert suggesting increased fluid. Called patient for symptom updates. She did not have blood pressures available. Her weights have gone down and she is concerned with that. Started at 114 pounds, today at 108 pounds. She states she has not noticed any swelling but is watching for that. She does experience a nagging cough that will bring up some clear phlegm. She doesn't feel like she can take deep breaths and notices more shortness of breath when walking now. Discussed her breathing and if she feels it is worsening to let us know. We are limited in options to provider over the phone, so the ER would be a back up if she feels the breathing is bad enough. Will review with provider. She has telehealth appt Friday with Fara Boros.

## 2020-07-19 NOTE — Telephone Encounter
Denita Lung, APRN-NP  P Cvm Nurse H[facility] Team [place]  Caller: Unspecified (Today, 10:58 AM)  I think this warrants further evaluation in the ER. It may just expedite the plan set for end of May. Her weights are going down so her shortness of breath shouldn't be getting worse. It could also be some persistent new atrial arrhythmias causing shortness of breath.     Thanks,   Jae Dire            Previous Messages      ----- Message -----   From: Corlis Leak, BSN   Sent: 07/19/2020 11:29 AM CDT   To: Denita Lung, APRN-NP, *     ----- Message from Corlis Leak, BSN sent at 07/19/2020 11:29 AM CDT -----   Jae Dire, received device alert with heart logic numbers remaining elevated. Spoke with patient, she states weights are down from 114 to 108 today, not taking torsemide any longer. She doesn't notice any swelling. She does have increased shortness of breath, feels like its hard to take deep breaths. She has a "nagging cough" that disturbs her and eventually brings up clear phlegm. No bps avail. Her main concern is the shortness of breath, we discussed if it worsens before her telehealth with Albin Felling on Friday, she may need ER assess.   Part of device alert included 3 ATR mode switches, EGMs Afib/flutter, with no history of fib/flutter and not on OAC.   Do you want to make any changes before telehealth with [facility] Friday or continue to monitor? She has another right heart cath 5/25? I noticed she just had one end of April. And OV with JST 5/26.     Thanks, [employer]

## 2020-07-20 ENCOUNTER — Inpatient Hospital Stay: Admit: 2020-07-20 | Discharge: 2020-07-20 | Payer: MEDICARE

## 2020-07-20 ENCOUNTER — Encounter: Admit: 2020-07-20 | Discharge: 2020-07-20 | Payer: MEDICARE

## 2020-07-20 ENCOUNTER — Inpatient Hospital Stay: Admit: 2020-07-20 | Discharge: 2020-07-19 | Payer: MEDICARE

## 2020-07-20 ENCOUNTER — Inpatient Hospital Stay: Admit: 2020-07-20 | Payer: MEDICARE

## 2020-07-20 DIAGNOSIS — I420 Dilated cardiomyopathy: Secondary | ICD-10-CM

## 2020-07-20 DIAGNOSIS — I472 Ventricular tachycardia: Secondary | ICD-10-CM

## 2020-07-20 MED ADMIN — DOBUTAMINE IN D5W 1,000 MG/250 ML (4,000 MCG/ML) IV SOLP [15982]: 3 ug/kg/min | INTRAVENOUS | @ 10:00:00 | NDC 00338107702

## 2020-07-20 MED ADMIN — HEPARIN, PORCINE (PF) 5,000 UNIT/0.5 ML IJ SYRG [95535]: 5000 [IU] | SUBCUTANEOUS | NDC 00409131611

## 2020-07-20 MED ADMIN — HEPARIN, PORCINE (PF) 5,000 UNIT/0.5 ML IJ SYRG [95535]: 5000 [IU] | SUBCUTANEOUS | @ 17:00:00 | NDC 00409131611

## 2020-07-20 MED ADMIN — FUROSEMIDE 10 MG/ML IJ SOLN [3291]: 40 mg | INTRAVENOUS | @ 10:00:00 | Stop: 2020-07-20 | NDC 70860030242

## 2020-07-20 MED ADMIN — HYDRALAZINE 25 MG PO TAB [3700]: 25 mg | ORAL | @ 17:00:00 | Stop: 2020-07-20 | NDC 00904644161

## 2020-07-20 NOTE — Telephone Encounter
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 $1,556 deductible for each benefit period  ? Days 1-60: $0 coinsurance for each benefit period  ? Days 61-90: $389 coinsurance per day of each benefit period  ? Days 91 and beyond: $778 coinsurance per each lifetime reserve day after day 90 for each benefit period (up to 60 days over your lifetime)  ? Beyond lifetime reserve days: all costs  PART A/ SNF BENEFITS: IN 2022: DAYS 1-20 PT PORTION $0; DAYS 21-100 $185.50/DAY COPAY; DAYS > 100 PT PORTION 100%  PART B / BENEFITS IN 2022:  The standard Part B premium amount is $170.10 (or higher depending on your income).  DEDUCTIBLE $233 WITH 80% REIMBURSEMENT OF ALLOWABLE, NO OUT OF POCKET MAXIMUM  MEDICARE WILL PAY FOR DRUGS INFUSED THROUGH AN ITEM OF DME, LIKE AN INFUSION PUMP, OR DRUGS GIVEN BY A NEBULIZER WHEN GIVEN BY A LICENSED MEDICAL PROVIDER.  PART B WILL COVER IMMUNOSUPPRESSIVE DRUGS IF [facility] COVERED THE TRANSPLANT, EVEN AS SECONDARY PAYER  No Case Management, No transplant Network, No prior authorizations, No benefit limits  Passport [employer] Ref#:  [id number]    PART D   RX Plan:[employer]          Phone #: [phone]  Spoke to: Ann      Call Reference#: [phone]  Current Subsidy: No Extra Help  Valcyte: Not Covered  Generic: $42 initial stage  Coverage gap in 2022:  Deductible Stage This is the period when you pay for your prescription drugs in full (reduced by the plans low negotiated rates) until you meet your yearly deductible amount.   Stage 1 pt pays as plan requires until total paid out = $4,430  Stage 2 coverage gap, pt pays 25% of cost of generics, 25% of cost of brand names until total paid out = $7050(pt and Plan)  Patient responsibility in gap is approximately 50% of $2,620 = $1,310.  Stage 3 catastrophic pt will pay as plan requires (small copays or coinsurances)      SECONDARY  ID #: [id number]    EFF:09/09/13     Subscriber:Self   Ins Plan:[employer] Supplement        Plan Type:Plan F  MEDICARE SUPPLEMENT PLAN F CANNOT BE PURCHASED AFTER 03/12/18  COVERS PT A COINSUR HOSPITAL COSTS UP TO AN ADDITIONAL 365 DAYS AFTER MEDICARE BENEFITS ARE USED UP,  MEDICARE PART B COINSURANCE OR COPAYMENT,  FIRST 3 PINTS OF BLOOD  PART A HOSPICE CARE COINSURANCE OR COPAYMENT  SKILLED NURSING FACILITY COINSURANCE  PART A DEDUCTIBLE  PART B DEDUCTIBLE  MEDICARE PREVENTIVE CARE PART B COINSURANCE  PART B EXCESS CHGS,  FOREIGN TRAVEL EMERGENCY UP TO PLAN LIMITS  MEDICARE PREVENTIVE CARE PART B COINSURANCE  Passport [employer] Ref#:  [id number]    VAD:  ZY:[id number]  WFU:[id number]  Heart Transplant:Covered     Authorization Requirements:  DT VAD: No Authorization Required  BTT VAD: No Authorization Required  Heart Transplant: No Authorization Required     BDCT Requirement for VAD: N/A  BDCT Requirement for Heart Transplant: N/A  Center Requirements:  BDCT: N/A  COE: N/A  IOE: N/A  Life Source: N/A     TXP Network:Medicare    Auth requirements:NO AUTHORIZATION REQUIRED FOR EVALUATION AND LISTING

## 2020-07-21 ENCOUNTER — Inpatient Hospital Stay: Admit: 2020-07-21 | Discharge: 2020-07-21 | Payer: MEDICARE

## 2020-07-21 MED ADMIN — HYDRALAZINE 10 MG PO TAB [3698]: 10 mg | ORAL | @ 15:00:00 | NDC 68084044711

## 2020-07-21 MED ADMIN — HYDRALAZINE 10 MG PO TAB [3698]: 10 mg | ORAL | @ 21:00:00 | NDC 68084044711

## 2020-07-21 MED ADMIN — HEPARIN, PORCINE (PF) 5,000 UNIT/0.5 ML IJ SYRG [95535]: 5000 [IU] | SUBCUTANEOUS | @ 21:00:00 | NDC 00409131611

## 2020-07-21 MED ADMIN — PEG-ELECTROLYTE SOLN 420 GRAM PO SOLR [79142]: 4 L | ORAL | @ 03:00:00 | Stop: 2020-07-21 | NDC 52268040001

## 2020-07-21 MED ADMIN — IRON SUCROSE 100 MG IRON/5 ML IV SOLN [80854]: 300 mg | INTRAVENOUS | @ 17:00:00 | Stop: 2020-07-21 | NDC 00517234001

## 2020-07-21 MED ADMIN — SODIUM CHLORIDE 0.9 % IV SOLP [27838]: 300 mg | INTRAVENOUS | @ 17:00:00 | Stop: 2020-07-21 | NDC 00338004902

## 2020-07-21 MED ADMIN — HEPARIN, PORCINE (PF) 5,000 UNIT/0.5 ML IJ SYRG [95535]: 5000 [IU] | SUBCUTANEOUS | @ 02:00:00 | NDC 00409131611

## 2020-07-21 MED ADMIN — HEPARIN, PORCINE (PF) 5,000 UNIT/0.5 ML IJ SYRG [95535]: 5000 [IU] | SUBCUTANEOUS | @ 11:00:00 | NDC 00409131611

## 2020-07-21 MED ADMIN — POTASSIUM CHLORIDE 20 MEQ/15 ML PO LIQD [6432]: 60 meq | ORAL | @ 05:00:00 | Stop: 2020-07-21 | NDC 00121168015

## 2020-07-22 ENCOUNTER — Encounter: Admit: 2020-07-22 | Discharge: 2020-07-22 | Payer: MEDICARE

## 2020-07-22 ENCOUNTER — Inpatient Hospital Stay: Admit: 2020-07-22 | Discharge: 2020-07-22 | Payer: MEDICARE

## 2020-07-22 DIAGNOSIS — E7800 Pure hypercholesterolemia, unspecified: Secondary | ICD-10-CM

## 2020-07-22 DIAGNOSIS — I429 Cardiomyopathy, unspecified: Secondary | ICD-10-CM

## 2020-07-22 DIAGNOSIS — I472 Ventricular tachycardia: Secondary | ICD-10-CM

## 2020-07-22 MED ORDER — PROPOFOL INJ 10 MG/ML IV VIAL
INTRAVENOUS | 0 refills | Status: DC
Start: 2020-07-22 — End: 2020-07-22
  Administered 2020-07-22: 22:00:00 10 mg via INTRAVENOUS
  Administered 2020-07-22 (×2): 30 mg via INTRAVENOUS

## 2020-07-22 MED ORDER — LIDOCAINE (PF) 20 MG/ML (2 %) IJ SOLN
INTRAVENOUS | 0 refills | Status: DC
Start: 2020-07-22 — End: 2020-07-22
  Administered 2020-07-22: 22:00:00 60 mg via INTRAVENOUS

## 2020-07-22 MED ORDER — LACTATED RINGERS IV SOLP
INTRAVENOUS | 0 refills | Status: DC
Start: 2020-07-22 — End: 2020-07-22
  Administered 2020-07-22: 22:00:00 via INTRAVENOUS

## 2020-07-22 MED ORDER — PHENYLEPHRINE HCL IN 0.9% NACL 1 MG/10 ML (100 MCG/ML) IV SYRG
INTRAVENOUS | 0 refills | Status: DC
Start: 2020-07-22 — End: 2020-07-22
  Administered 2020-07-22 (×2): 200 ug via INTRAVENOUS

## 2020-07-22 MED ADMIN — HYDRALAZINE 10 MG PO TAB [3698]: 10 mg | ORAL | @ 10:00:00 | NDC 68084044711

## 2020-07-22 MED ADMIN — MAGNESIUM SULFATE IN D5W 1 GRAM/100 ML IV PGBK [166578]: 1 g | INTRAVENOUS | @ 14:00:00 | Stop: 2020-07-22 | NDC 00338170940

## 2020-07-22 MED ADMIN — HYDRALAZINE 10 MG PO TAB [3698]: 10 mg | ORAL | @ 02:00:00 | NDC 68084044711

## 2020-07-22 MED ADMIN — HEPARIN, PORCINE (PF) 5,000 UNIT/0.5 ML IJ SYRG [95535]: 5000 [IU] | SUBCUTANEOUS | @ 10:00:00 | NDC 00409131611

## 2020-07-22 MED ADMIN — HYDRALAZINE 10 MG PO TAB [3698]: 10 mg | ORAL | NDC 68084044711

## 2020-07-22 MED ADMIN — HEPARIN, PORCINE (PF) 5,000 UNIT/0.5 ML IJ SYRG [95535]: 5000 [IU] | SUBCUTANEOUS | @ 02:00:00 | NDC 00409131611

## 2020-07-22 MED ADMIN — POTASSIUM CHLORIDE 20 MEQ PO TBTQ [35943]: 40 meq | ORAL | @ 10:00:00 | Stop: 2020-07-22 | NDC 00245531989

## 2020-07-22 MED ADMIN — MAGNESIUM SULFATE IN D5W 1 GRAM/100 ML IV PGBK [166578]: 1 g | INTRAVENOUS | @ 10:00:00 | Stop: 2020-07-22 | NDC 00338170940

## 2020-07-22 NOTE — Progress Notes
Transplant Pharmacy Evaluation    I met with Gloria Houston for their pharmacy transplant evaluation.    Patient Assessment  Patient has prescription drug coverage: yes; Medicare Part D [employer]  Current opioid/benzodiazepine use: no  Current antiplatelet/anticoagulant use: yes; aspirin 81 mg daily    Medication Adherence Assessment  Method(s) used to manage medications: medication list    Key (0=never, 1=rarely, 2=sometimes, 3=often, 4=always)   How often do you forget to take one or more of your prescription medications?: 0  How often do you decide not to take one or more of your prescription medications?: 0  How often do you run out of one or more of your prescription medications before getting a new refill?: 0  When you feel good, do you sometimes stop taking one or more of your prescription medications?: 0  When you don't feel good, do you sometimes stop taking one or more of your prescription medications?: 0  How often do you have difficulty swallowing medications or taking your medications?: 0  How often do you use a friend, family member, or co-worker's medications?: 0  How often do you have difficulty affording your prescription medications?: 0  How often does your pharmacy have difficulty filling your prescription medications?: 0  Total points: 0  Assessment: Adherence is excellent.    Education  During this visit the patient was provided with an overview of therapies that will be initiated post-transplant including but not limited to significant side effects and dosing schedules. Emphasis was placed on the importance of medication adherence both pre- and post-transplant.     Assessment and Recommendations  I had the pleasure of meeting with Gloria Houston for a transplant pharmacy evaluation. Gloria Houston demonstrates excellent medication adherence.  All questions were addressed at the time of our encounter. Gloria Houston expressed understanding and was encouraged to contact the pharmacist with follow-up questions.    Potential drug interactions: none  Recommended changes to medications related to listing or transplant: none  Potential challenges with transplantation: none. Of note, we discussed her rosuvastatin dose. she has historically not tolerated higher doses of statins, so I would recommend continuing her current dosing strategy of rosuvastatin 5 mg three times weekly to prevent recurrent myalgias.     Recommendations: There are not medication related challenges with transplantation.    Home Medications    Medication Sig   ascorbic acid (VITAMIN C) 500 mg PO tablet Take 500 mg by mouth Daily.   Aspirin 81 mg PO Tab Take 1 Tab by mouth Daily.   CALCIUM CARBONATE/VITAMIN D3 (CALCIUM 500 + D PO) Take 1 tablet by mouth three times daily.   cholecalciferol (VITAMIN D-3) 5000 unit tablet Take 5,000 Units by mouth daily.   empagliflozin (JARDIANCE) 10 mg tablet Take one tablet by mouth daily.   enalapril maleate (VASOTEC) 5 mg tablet Take one tablet by mouth twice daily. 07/10/2020 HOLD this medication until you follow up in 1 week. Check your blood pressure (BP) at home and tell the provider your BP at that appointment, ask if you should restart it.   LACTOBACILLUS ACIDOPHILUS (FLORAJEN PO) Take 1 tablet by mouth daily.   Magnesium 200 mg tab Take 1 tablet by mouth daily.   metoprolol XL (TOPROL XL) 100 mg extended release tablet Take one tablet by mouth daily.   multivit/folic acid/vit K1 (ONE-A-DAY WOMEN'S 50 PLUS PO) Take 1 tablet by mouth daily.   omega 3-dha-epa-fish oil 900-1,400 mg cpDR Take 1 capsule by  mouth daily.   rosuvastatin (CRESTOR) 5 mg tablet Take one tablet by mouth as directed. Start 3 nights a week and let us know how you tolerate   spironolactone (ALDACTONE) 25 mg tablet Take one tablet by mouth twice daily. Take with food.   torsemide (DEMADEX) 20 mg tablet Take one tablet by mouth three times weekly. Take on Monday Wednesday Friday if weight is greater than 110 lbs. vit C/E/Zn/coppr/lutein/zeaxan (PRESERVISION AREDS-2 PO) Take 1 tablet by mouth twice daily.   vitamins, B complex tab Take 1 Tab by mouth daily.       Sherlyn Hay, PHARMD

## 2020-07-22 NOTE — Anesthesia Post-Procedure Evaluation
Post-Anesthesia Evaluation    Name: Gloria Houston      MRN: [id number]     DOB: [birth year]     Age: 70 y.o.     Sex: female   __________________________________________________________________________     Procedure Information     Anesthesia Start Date/Time: 07/22/20 1653    Procedures:       ESOPHAGOGASTRODUODENOSCOPY WITH SPECIMEN COLLECTION BY BRUSHING/ WASHING (N/A )      COLONOSCOPY DIAGNOSTIC WITH SPECIMEN COLLECTION BY BRUSHING/ WASHING - FLEXIBLE (N/A )    Location: [facility] / ENDO/GI    Surgeons: Isaiah Blakes, MD          Post-Anesthesia Vitals  BP: 104/80 (05/13 1755)  Temp: 36.4 C (97.5 F) (05/13 1738)  Pulse: 118 (05/13 1755)  Respirations: 20 PER MINUTE (05/13 1755)  SpO2: 98 % (05/13 1755)  SpO2 Pulse: 118 (05/13 1755)  O2 Device: None (Room air) (05/13 1755)   Vitals Value Taken Time   BP 104/80 07/22/20 1755   Temp 36.4 C (97.5 F) 07/22/20 1738   Pulse 118 07/22/20 1755   Respirations 20 PER MINUTE 07/22/20 1755   SpO2 98 % 07/22/20 1755   O2 Device None (Room air) 07/22/20 1755   ABP     ART BP           Post Anesthesia Evaluation Note    Evaluation location: Pre/Post  Patient participation: recovered; patient participated in evaluation  Level of consciousness: alert    Pain score: 0  Pain management: adequate    Hydration: normovolemia  Temperature: 36.0C - 38.4C  Airway patency: adequate    Perioperative Events       Post-op nausea and vomiting: no PONV    Postoperative Status  Cardiovascular status: hemodynamically stable  Respiratory status: spontaneous ventilation  Additional comments: Patient tolerated MAC without apparent anaesthetic complications.        Perioperative Events  No complications documented.

## 2020-07-22 NOTE — Anesthesia Pre-Procedure Evaluation
Anesthesia Pre-Procedure Evaluation    Name: Gloria Houston      MRN: [id number]     DOB: August 30, 1950     Age: 70 y.o.     Sex: female   _________________________________________________________________________     Procedure Info:   Procedure Information     Date/Time: 07/22/20 1555    Procedures:       ESOPHAGOGASTRODUODENOSCOPY WITH BIOPSY - FLEXIBLE (N/A )      COLONOSCOPY WITH BIOPSY - FLEXIBLE (N/A )    Location: [facility] / ENDO/GI    Surgeons: Jolee Ewing, MD          Physical Assessment  Vital Signs (last filed in past 24 hours):  BP: 99/67 (05/13 1620)  Temp: 36.6 ?C (97.8 ?F) (05/13 1620)  Pulse: 119 (05/13 1620)  Respirations: 19 PER MINUTE (05/13 1620)  SpO2: 99 % (05/13 1620)  O2 Device: None (Room air) (05/13 1620)  Weight: 52.1 kg (114 lb 12.8 oz) (05/13 0500)      Patient History   No Known Allergies     Current Medications    Medication Directions   ascorbic acid (VITAMIN C) 500 mg PO tablet Take 500 mg by mouth Daily.   Aspirin 81 mg PO Tab Take 1 Tab by mouth Daily.   CALCIUM CARBONATE/VITAMIN D3 (CALCIUM 500 + D PO) Take 1 tablet by mouth three times daily.   cholecalciferol (VITAMIN D-3) 5000 unit tablet Take 5,000 Units by mouth daily.   empagliflozin (JARDIANCE) 10 mg tablet Take one tablet by mouth daily.   enalapril maleate (VASOTEC) 5 mg tablet Take one tablet by mouth twice daily. 07/10/2020 HOLD this medication until you follow up in 1 week. Check your blood pressure (BP) at home and tell the provider your BP at that appointment, ask if you should restart it.   LACTOBACILLUS ACIDOPHILUS (FLORAJEN PO) Take 1 tablet by mouth daily.   Magnesium 200 mg tab Take 1 tablet by mouth daily.   metoprolol XL (TOPROL XL) 100 mg extended release tablet Take one tablet by mouth daily.   multivit/folic acid/vit K1 (ONE-A-DAY WOMEN'S 50 PLUS PO) Take 1 tablet by mouth daily.   omega 3-dha-epa-fish oil 900-1,400 mg cpDR Take 1 capsule by mouth daily.   rosuvastatin (CRESTOR) 5 mg tablet Take one tablet by mouth as directed. Start 3 nights a week and let us know how you tolerate   spironolactone (ALDACTONE) 25 mg tablet Take one tablet by mouth twice daily. Take with food.   torsemide (DEMADEX) 20 mg tablet Take one tablet by mouth three times weekly. Take on Monday Wednesday Friday if weight is greater than 110 lbs.   vit C/E/Zn/coppr/lutein/zeaxan (PRESERVISION AREDS-2 PO) Take 1 tablet by mouth twice daily.   vitamins, B complex tab Take 1 Tab by mouth daily.         Review of Systems/Medical History        PONV Screening: Female gender and Non-smoker          Cardiovascular         Exercise tolerance: <4 METS       Beta Blocker therapy: Yes      Beta blockers within 24 hours: Yes      CHF; NYHA Classification: IV      Hyperlipidemia           Physical Exam    Airway Findings      Mallampati: II      TM distance: >3 FB  Neck ROM: full      Mouth opening: good      Airway patency: adequate    Pulmonary Findings:       Breath sounds clear to auscultation.    Abdominal Findings:         Abdominal exam deferred    Neurological Findings:       Alert and oriented x 3    Constitutional findings:       No acute distress      Well-developed      Well-nourished       Diagnostic Tests  Hematology:   Lab Results   Component Value Date    HGB 12.6 07/22/2020    HCT 37.7 07/22/2020    PLTCT 187 07/22/2020    WBC 9.1 07/22/2020    NEUT 77 07/22/2020    ANC 7.04 07/22/2020    ALC 1.16 07/22/2020    MONA 6 07/22/2020    AMC 0.58 07/22/2020    EOSA 2 07/22/2020    ABC 0.18 07/22/2020    MCV 91.3 07/22/2020    MCH 30.5 07/22/2020    MCHC 33.4 07/22/2020    MPV 9.7 07/22/2020    RDW 16.8 07/22/2020         General Chemistry:   Lab Results   Component Value Date    NA 138 07/22/2020    K 3.6 07/22/2020    CL 99 07/22/2020    CO2 26 07/22/2020    GAP 13 07/22/2020    BUN 20 07/22/2020    CR 0.98 07/22/2020    GLU 67 07/22/2020    CA 8.5 07/22/2020    ALBUMIN 3.7 07/22/2020    LACTIC 1.7 07/20/2020    MG 1.8 07/22/2020    TOTBILI 1.0 07/22/2020      Coagulation:   Lab Results   Component Value Date    PT 12.8 07/20/2020    PTT 28.9 07/20/2020    INR 1.1 07/20/2020         Anesthesia Plan    ASA score: 3   Plan: MAC  NPO status: acceptable    Comments: (MAC planned and discussed, light sedation necessary given her 15%EF.  Currently on dobutamine.)

## 2020-07-23 ENCOUNTER — Inpatient Hospital Stay: Admit: 2020-07-23 | Discharge: 2020-07-23 | Payer: MEDICARE

## 2020-07-23 ENCOUNTER — Encounter: Admit: 2020-07-23 | Discharge: 2020-07-23 | Payer: MEDICARE

## 2020-07-23 DIAGNOSIS — I472 Ventricular tachycardia: Secondary | ICD-10-CM

## 2020-07-23 DIAGNOSIS — I429 Cardiomyopathy, unspecified: Secondary | ICD-10-CM

## 2020-07-23 DIAGNOSIS — E7800 Pure hypercholesterolemia, unspecified: Secondary | ICD-10-CM

## 2020-07-23 MED ADMIN — HEPARIN, PORCINE (PF) 5,000 UNIT/0.5 ML IJ SYRG [95535]: 5000 [IU] | SUBCUTANEOUS | @ 02:00:00 | NDC 00409131611

## 2020-07-23 MED ADMIN — HEPARIN, PORCINE (PF) 5,000 UNIT/0.5 ML IJ SYRG [95535]: 5000 [IU] | SUBCUTANEOUS | @ 19:00:00 | NDC 00409131611

## 2020-07-23 MED ADMIN — AMIODARONE 50 MG/ML IV SOLN [9065]: 150 mg | INTRAVENOUS | @ 18:00:00 | Stop: 2020-07-23 | NDC 00143987501

## 2020-07-23 MED ADMIN — DEXTROSE 5% IN WATER IV SOLP [2364]: 100 mL | INTRAVENOUS | @ 18:00:00 | Stop: 2020-07-23 | NDC 00338001738

## 2020-07-23 MED ADMIN — DIPHTH,PERTUS(ACELL),TETANUS 2.5-8-5 LF-MCG-LF/0.5ML IM SUSP [94541]: 0.5 mL | INTRAMUSCULAR | @ 02:00:00 | Stop: 2020-07-23 | NDC 58160084201

## 2020-07-23 MED ADMIN — SPIRONOLACTONE 50 MG PO TAB [11426]: 50 mg | ORAL | @ 02:00:00 | NDC 60687047611

## 2020-07-23 MED ADMIN — FUROSEMIDE 10 MG/ML IJ SOLN [3291]: 20 mg | INTRAVENOUS | @ 16:00:00 | Stop: 2020-07-23 | NDC 63323028001

## 2020-07-23 MED ADMIN — SODIUM CHLORIDE 0.9 % IV SOLP [27838]: 250 mL | INTRAVENOUS | @ 05:00:00 | Stop: 2020-07-23 | NDC 00338004904

## 2020-07-23 MED ADMIN — FUROSEMIDE 10 MG/ML IJ SOLN [3291]: 20 mg | INTRAVENOUS | @ 20:00:00 | Stop: 2020-07-23 | NDC 63323028001

## 2020-07-23 MED ADMIN — HYDRALAZINE 10 MG PO TAB [3698]: 10 mg | ORAL | @ 10:00:00 | NDC 68084044711

## 2020-07-23 MED ADMIN — ZOSTER VACCINE RECOMBINANT LYOPHILIZED ANTIGEN 2 OF 2 50 MCG IM SUSR [334882]: 0.5 mL | INTRAMUSCULAR | @ 05:00:00 | Stop: 2020-07-23 | NDC 58160082803

## 2020-07-23 MED ADMIN — HEPATITIS A AND B VACCINE (PF) 720 ELISA UNIT- 20 MCG/ML IM SYRG [163464]: 1 mL | INTRAMUSCULAR | @ 05:00:00 | Stop: 2020-07-23 | NDC 58160081543

## 2020-07-23 MED ADMIN — SPIRONOLACTONE 50 MG PO TAB [11426]: 50 mg | ORAL | @ 12:00:00 | NDC 60687047611

## 2020-07-23 MED ADMIN — HEPARIN, PORCINE (PF) 5,000 UNIT/0.5 ML IJ SYRG [95535]: 5000 [IU] | SUBCUTANEOUS | @ 10:00:00 | NDC 00409131611

## 2020-07-23 MED ADMIN — HYDRALAZINE 10 MG PO TAB [3698]: 10 mg | ORAL | @ 02:00:00 | NDC 68084044711

## 2020-07-23 MED ADMIN — AMIODARONE IN DEXTROSE,ISO-OSM 360 MG/200 ML (1.8 MG/ML) IV SOLN [307603]: 0.5 mg/min | INTRAVENOUS | @ 18:00:00 | NDC 43066036020

## 2020-07-23 MED ADMIN — LACTATED RINGERS IV SOLP [4318]: 250 mL | INTRAVENOUS | @ 13:00:00 | Stop: 2020-07-23 | NDC 00338011704

## 2020-07-24 ENCOUNTER — Inpatient Hospital Stay: Admit: 2020-07-24 | Discharge: 2020-07-24 | Payer: MEDICARE

## 2020-07-24 ENCOUNTER — Encounter: Admit: 2020-07-24 | Discharge: 2020-07-24 | Payer: MEDICARE

## 2020-07-24 MED ADMIN — HYDRALAZINE 10 MG PO TAB [3698]: 10 mg | ORAL | @ 02:00:00 | NDC 68084044711

## 2020-07-24 MED ADMIN — AMIODARONE IN DEXTROSE,ISO-OSM 360 MG/200 ML (1.8 MG/ML) IV SOLN [307603]: 0.5 mg/min | INTRAVENOUS | @ 19:00:00 | Stop: 2020-07-25 | NDC 43066036020

## 2020-07-24 MED ADMIN — MAGNESIUM SULFATE IN D5W 1 GRAM/100 ML IV PGBK [166578]: 1 g | INTRAVENOUS | @ 10:00:00 | Stop: 2020-07-24 | NDC 00338170940

## 2020-07-24 MED ADMIN — DIAZEPAM 5 MG PO TAB [2405]: 5 mg | ORAL | @ 03:00:00 | Stop: 2020-07-24 | NDC 00172392660

## 2020-07-24 MED ADMIN — SPIRONOLACTONE 50 MG PO TAB [11426]: 50 mg | ORAL | @ 13:00:00 | NDC 60687047611

## 2020-07-24 MED ADMIN — SPIRONOLACTONE 50 MG PO TAB [11426]: 50 mg | ORAL | @ 02:00:00 | NDC 60687047611

## 2020-07-24 MED ADMIN — CHLOROTHIAZIDE SODIUM 500 MG IV SOLR [81318]: 500 mg | INTRAVENOUS | @ 21:00:00 | Stop: 2020-07-24 | NDC 17478041940

## 2020-07-24 MED ADMIN — AMIODARONE IN DEXTROSE,ISO-OSM 360 MG/200 ML (1.8 MG/ML) IV SOLN [307603]: 0.5 mg/min | INTRAVENOUS | @ 07:00:00 | Stop: 2020-07-25 | NDC 43066036020

## 2020-07-24 MED ADMIN — WATER FOR INJECTION, STERILE IJ SOLN [79513]: 20 mL | INTRAVENOUS | @ 21:00:00 | Stop: 2020-07-24 | NDC 00409488723

## 2020-07-24 MED ADMIN — HEPARIN, PORCINE (PF) 5,000 UNIT/0.5 ML IJ SYRG [95535]: 5000 [IU] | SUBCUTANEOUS | @ 20:00:00 | NDC 00409131611

## 2020-07-24 MED ADMIN — FUROSEMIDE 10 MG/ML IJ SOLN [3291]: 40 mg | INTRAVENOUS | @ 20:00:00 | NDC 70860030242

## 2020-07-24 MED ADMIN — DOBUTAMINE IN D5W 1,000 MG/250 ML (4,000 MCG/ML) IV SOLP [15982]: 4 ug/kg/min | INTRAVENOUS | @ 20:00:00 | NDC 00338107702

## 2020-07-24 MED ADMIN — HEPARIN, PORCINE (PF) 5,000 UNIT/0.5 ML IJ SYRG [95535]: 5000 [IU] | SUBCUTANEOUS | @ 02:00:00 | NDC 00409131611

## 2020-07-24 MED ADMIN — FUROSEMIDE 10 MG/ML IJ SOLN [3291]: 40 mg | INTRAVENOUS | @ 13:00:00 | Stop: 2020-07-24 | NDC 70860030242

## 2020-07-24 MED ADMIN — HEPARIN, PORCINE (PF) 5,000 UNIT/0.5 ML IJ SYRG [95535]: 5000 [IU] | SUBCUTANEOUS | @ 10:00:00 | NDC 00409131611

## 2020-07-25 ENCOUNTER — Encounter: Admit: 2020-07-25 | Discharge: 2020-07-25 | Payer: MEDICARE

## 2020-07-25 ENCOUNTER — Inpatient Hospital Stay: Admit: 2020-07-25 | Discharge: 2020-07-25 | Payer: MEDICARE

## 2020-07-25 MED ADMIN — HEPARIN, PORCINE (PF) 5,000 UNIT/0.5 ML IJ SYRG [95535]: 5000 [IU] | SUBCUTANEOUS | @ 19:00:00 | NDC 00409131611

## 2020-07-25 MED ADMIN — SPIRONOLACTONE 50 MG PO TAB [11426]: 50 mg | ORAL | @ 02:00:00 | NDC 60687047611

## 2020-07-25 MED ADMIN — DOBUTAMINE IN D5W 1,000 MG/250 ML (4,000 MCG/ML) IV SOLP [15982]: 4 ug/kg/min | INTRAVENOUS | @ 02:00:00 | NDC 00338107702

## 2020-07-25 MED ADMIN — SPIRONOLACTONE 50 MG PO TAB [11426]: 50 mg | ORAL | @ 15:00:00 | NDC 60687047611

## 2020-07-25 MED ADMIN — ONDANSETRON HCL (PF) 4 MG/2 ML IJ SOLN [136012]: 4 mg | INTRAVENOUS | @ 01:00:00 | NDC 36000001225

## 2020-07-25 MED ADMIN — HEPARIN, PORCINE (PF) 5,000 UNIT/0.5 ML IJ SYRG [95535]: 5000 [IU] | SUBCUTANEOUS | @ 11:00:00 | NDC 00409131611

## 2020-07-25 MED ADMIN — HYDROXYZINE HCL 25 MG PO TAB [3774]: 25 mg | ORAL | @ 02:00:00 | NDC 00904661761

## 2020-07-25 MED ADMIN — HEPARIN, PORCINE (PF) 5,000 UNIT/0.5 ML IJ SYRG [95535]: 5000 [IU] | SUBCUTANEOUS | @ 02:00:00 | NDC 00409131611

## 2020-07-25 NOTE — Telephone Encounter
INSURANCE COVERAGE / FINANCIAL INFORMATION    FINANCIAL TRANSPLANT EVALUATION      TFA Redmond Baseman met with Michiel Sites for insurance consult. There appears to be no coverage/insurance barriers to transplant with Medicare A&B and Medico Supplement F plan.  Explained to [facility] the estimated cost of Post Transplant medications and provided patient with a copy of her liabilities. Salley expressed no insurance concerns related to cost of post-transplant care and medications and verbalized understanding of Transplant Finance Coordinator's discussion and documents presented. Advised patient to call me if any insurance changes. Provided patient with my contact information. Patient verbalized understanding. No Authorization required prior to listing.    Documents presented:  Signed Patient Liability Form, Medication Cost Estimate.  PATIENT HAS BEEN ADVISED THAT AS A TRANSPLANT PATIENT SHE WILL NOT QUALIFY FOR FINANCIAL ASSISTANCE. Patient verbalized understanding.    *Financially Cleared  *NO Financial Concerns Meryl Crutch

## 2020-07-26 ENCOUNTER — Encounter: Admit: 2020-07-26 | Discharge: 2020-07-26 | Payer: MEDICARE

## 2020-07-26 MED ADMIN — HEPARIN, PORCINE (PF) 5,000 UNIT/0.5 ML IJ SYRG [95535]: 5000 [IU] | SUBCUTANEOUS | @ 19:00:00 | Stop: 2020-07-26 | NDC 00409131611

## 2020-07-26 MED ADMIN — SPIRONOLACTONE 50 MG PO TAB [11426]: 50 mg | ORAL | @ 03:00:00 | NDC 60687047611

## 2020-07-26 MED ADMIN — SENNOSIDES 8.6 MG PO TAB [11349]: 2 | ORAL | @ 16:00:00 | Stop: 2020-07-26 | NDC 00904652261

## 2020-07-26 MED ADMIN — GUAIFENESIN 600 MG PO TA12 [321343]: 600 mg | ORAL | @ 14:00:00 | Stop: 2020-07-26 | NDC 68084057211

## 2020-07-26 MED ADMIN — POLYETHYLENE GLYCOL 3350 17 GRAM PO PWPK [25424]: 17 g | ORAL | @ 16:00:00 | Stop: 2020-07-26 | NDC 00904693186

## 2020-07-26 MED ADMIN — HYDRALAZINE 10 MG PO TAB [3698]: 10 mg | ORAL | @ 14:00:00 | Stop: 2020-07-26 | NDC 68084044711

## 2020-07-26 MED ADMIN — SPIRONOLACTONE 50 MG PO TAB [11426]: 50 mg | ORAL | @ 14:00:00 | Stop: 2020-07-26 | NDC 60687047611

## 2020-07-26 MED ADMIN — HEPARIN, PORCINE (PF) 5,000 UNIT/0.5 ML IJ SYRG [95535]: 5000 [IU] | SUBCUTANEOUS | @ 03:00:00 | NDC 00409131611

## 2020-07-26 MED ADMIN — HEPARIN, PORCINE (PF) 5,000 UNIT/0.5 ML IJ SYRG [95535]: 5000 [IU] | SUBCUTANEOUS | @ 12:00:00 | Stop: 2020-07-26 | NDC 00409131611

## 2020-07-26 NOTE — Telephone Encounter
INSURANCE COVERAGE / FINANCIAL INFORMATION    Programmer, multimedia has been verified though [employer].  As of 07/26/20 patient has ACTIVE  Primary: Medicare A&B. Ref.#: [id number]   Secondary: Medico Suppl F. Ref.#: [id number] [id number]

## 2020-07-27 ENCOUNTER — Encounter: Admit: 2020-07-27 | Discharge: 2020-07-27 | Payer: MEDICARE

## 2020-07-27 NOTE — Committee Review
Committee Review Note      Evaluation Date: 07/20/2020  Committee Review Date: 07/27/2020    Organ being evaluated for: Heart    Transplant Phase: Evaluation  Transplant Status: Active    Transplant Coordinator: Kathrin Ruddy  Transplant Surgeon:       Referring Physician:     Primary Diagnosis: Dilated Myopathy: Familial  Secondary Diagnosis:     Committee Review Members:  Anesthesiology Bard Herbert, MD   Cardiovascular Disease Abby Marijo Sanes, APRN-NP, Kathleen Argue, DO, Bhanu Malachi Paradise, MD, Prudencio Burly, MD, Bland Span, MD, Sherle Poe, APRN-NP, Reubin Milan, MD, Arneta Cliche, MD   Dietician, Registered Christiana Fuchs, RD   Financial Counseling and Assistance Services [facility] Lucinda Du Teau, Valeda Malm   Palliative Medicine [facility] Saverio Danker, APRN-NP, [place] Circle, MD   Pastoral Care [facility]   Pharmacist Sherlyn Hay, [place]   Psychiatry Rutherford Limerick, DO   Social Worker, Clinical Lennie Muckle   Transplant Services Clotilde Dieter, [place], Gaynelle Adu, RN, Arthor Captain, RN, Alvina Filbert, RN, Eliezer Bottom, RN, Kathrin Ruddy, RN, Merlyn Albert, RN, Hassell Done, RN, Pearlie Oyster, APRN-CNS, Shon Baton, RN, Sandria Senter, Marcheta Grammes, RN   Transplant Surgery Corinne Michael Boston, APRN-NP, Tracey Harries, APRN-NP, Eino Farber, MD, Virginia Crews, MD, Meriel Pica, RN       Transplant Indications: Age 58 years or greater and at least one of the following, Significant functional limitation (NYHA Class III or IV), Stage D heart failure (ie; heart failure refractory to conventional medical/surgical therapy)      Committee Review Decision: Declined    Relative Contraindications:     Absolute Contraindications:     Committee Discussion Details: Gloria Houston presented to our health system for advanced heart failure therapies evaluation.  After completed testing and consultation, she was presented to our selection committee.  It was determined that [name] had decided she no longer wished to pursue AHF therapies.  Her evaluation was stopped and she was eventually discharged home on hospice per her wishes.  We will decline her for AHF therapies at this time.        DT VAD Information (If Applicable)    VAD Destination Therapy Indications: N/A    VAD Destination Therapy Relative Contraindications: N/A    VAD Destination Therapy Absolute Contraindications: N/A

## 2020-08-01 ENCOUNTER — Encounter: Admit: 2020-08-01 | Discharge: 2020-08-01 | Payer: MEDICARE

## 2020-08-01 NOTE — Telephone Encounter
-----   Message from Cheri Guppy, RN sent at 08/01/2020  2:14 PM CDT -----  Regarding: Just received info pt is deceased.  Updated in [facility] but not [facility] site.   ----- Message -----  From: Weston Brass  Sent: 08/01/2020   2:10 PM CDT  To: Cheri Guppy, RN  Subject: RE: Heart logic @ 32 on 5/17 review with pt/#    Pt deceased  ----- Message -----  From: Cheri Guppy, RN  Sent: 08/01/2020   1:21 PM CDT  To: Weston Brass  Subject: RE: Heart logic @ 32 on 5/17 review with pt/#    So we will continue to send these as alerts unless the CVM physician orders otherwise.   They will often change the criteria for [place] to send an alert and message during these types of scenarios, but we need verbal orders to change alert criteria.   Thank you.     ----- Message -----  From: Weston Brass  Sent: 07/27/2020   1:49 PM CDT  To: Cheri Guppy, RN  Subject: RE: Heart logic @ 32 on 5/17 review with pt/#    Patient elected to pursue palliative approach and did not want further evaluation or intervention for her end-stage heart failure.  Patient then elected to continue dobutamine for palliative measures only. Patient was advised to take torsemide 20 mg oral as needed for symptom relief associated with volume overload.  TPOPP was completed prior to discharge.  Patient also elected to maintain her ICD active upon discharge.  Patient was stable on day of discharge and was ultimately discharged home with home hospice care set up  ----- Message -----  From: Cheri Guppy, RN  Sent: 07/27/2020   8:40 AM CDT  To: Cvm H[facility] Heart Logic, Cvm Nurse [place]  Subject: Heart logic @ 32 on 5/17 review with pt/team     Notes indicate pt may be hospitalized elsewhere. If number to report needs to change, please message me as will update- otherwise this will alert until at 6.    Thank you,  Victorino Dike

## 2020-08-01 NOTE — Telephone Encounter
Patient passed Murj and [place]  Updated No other action is required at this time.   CDJ

## 2020-08-03 ENCOUNTER — Encounter: Admit: 2020-08-03 | Discharge: 2020-08-03 | Payer: MEDICARE

## 2020-08-04 ENCOUNTER — Encounter: Admit: 2020-08-04 | Discharge: 2020-08-04 | Payer: MEDICARE

## 2020-08-04 NOTE — Progress Notes
POST-MORTEM DOCUMENTATION    Name: Gloria Houston        MRN: [id number]          DOB: 02/19/1951            Age: 70 y.o.  Admission Date: (Not on file)       LOS: 0 days    Date of Service: 2020/08/17      Date of death if known:  Aug 14, 2020  Location of death, if known:Home  How were you notified?  MyChart message  Who notified us of death? Weston Brass    Was hospice involved? Yes  Name of hospice involved, if known: NA  Date of hospice admission, if known: NA    Other information:

## 2020-08-10 DEATH — deceased

## 2020-09-01 ENCOUNTER — Encounter: Admit: 2020-09-01 | Discharge: 2020-09-01 | Payer: MEDICARE

## 2021-01-27 IMAGING — MG MAMMOGRAM 3D SCREEN, BILATERAL
12 of 16 series · 12 of 16 positions shown · non-contrast
Comparison: none

[R CC (1 of 2)]
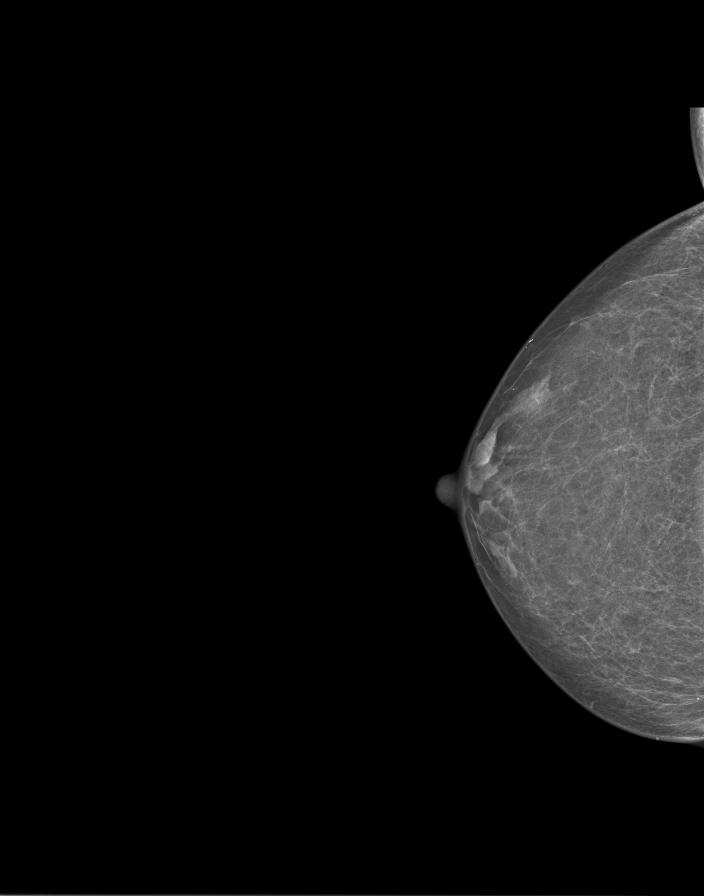

[R tomo (1 of 2)]
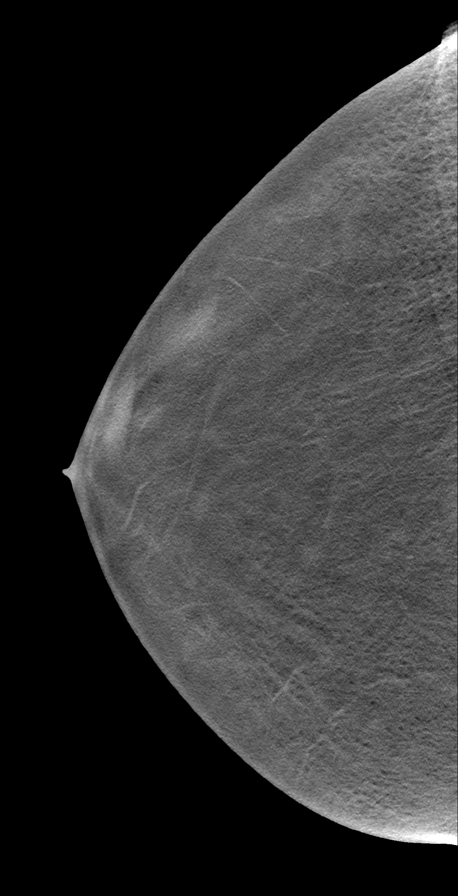

[R CC (2 of 2)]
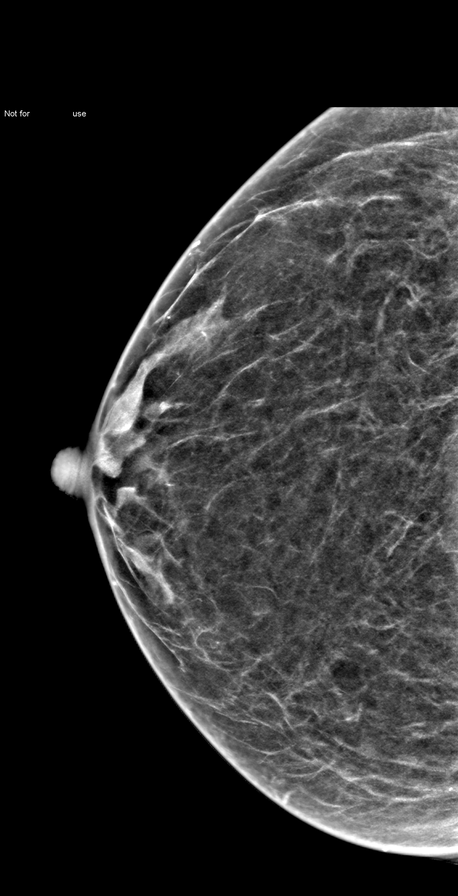

[R (1 of 2)]
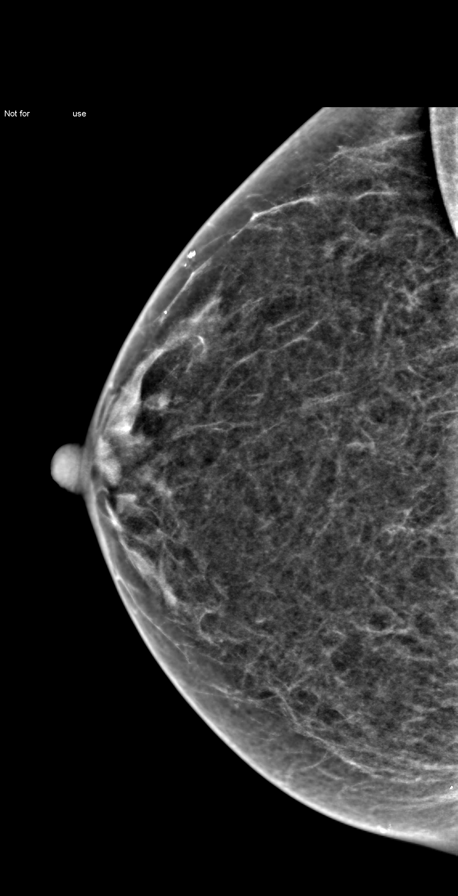

[L CC (1 of 2)]
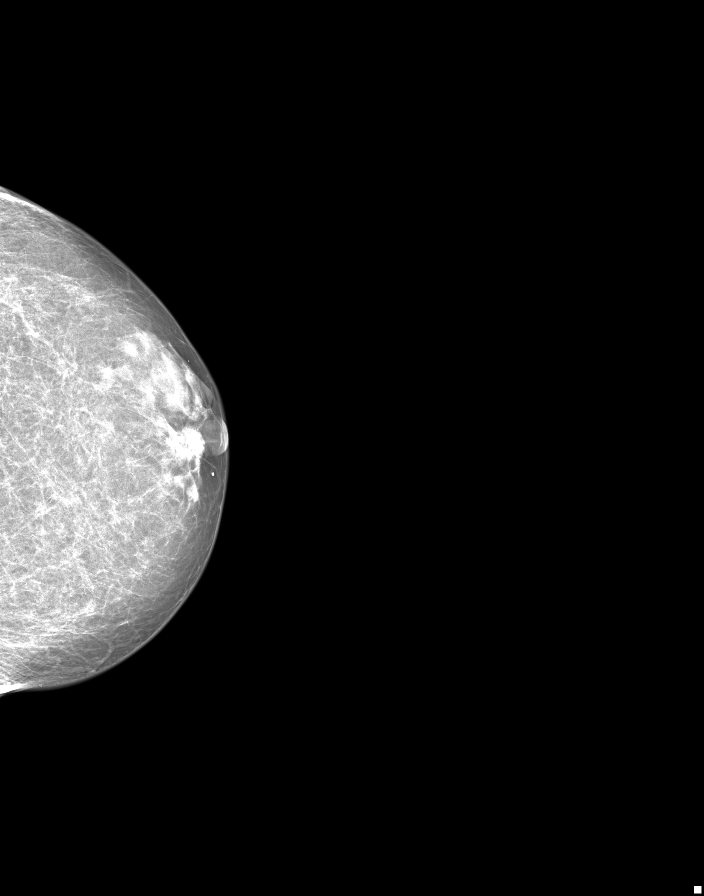

[L tomo]
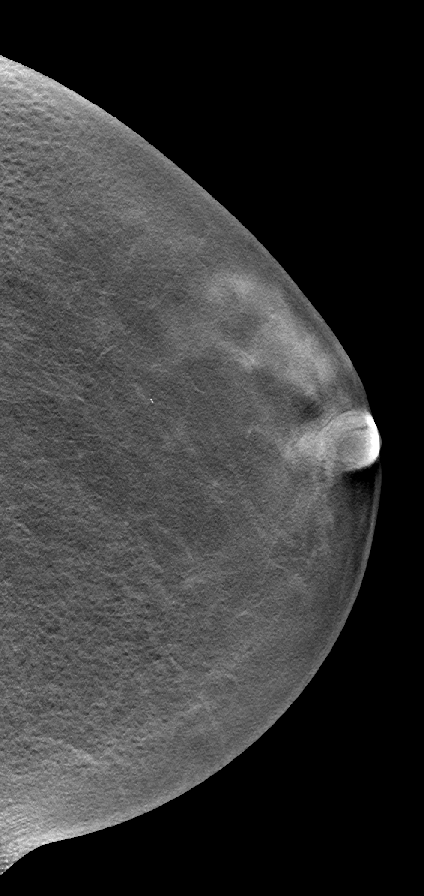

[L CC (2 of 2)]
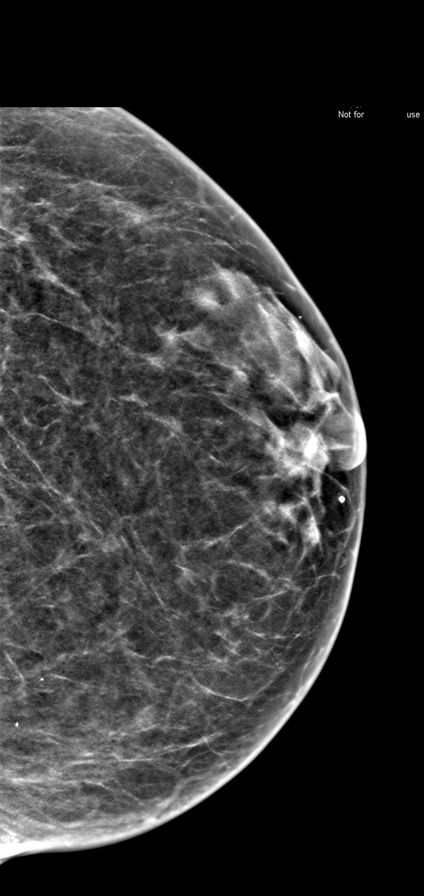

[L]
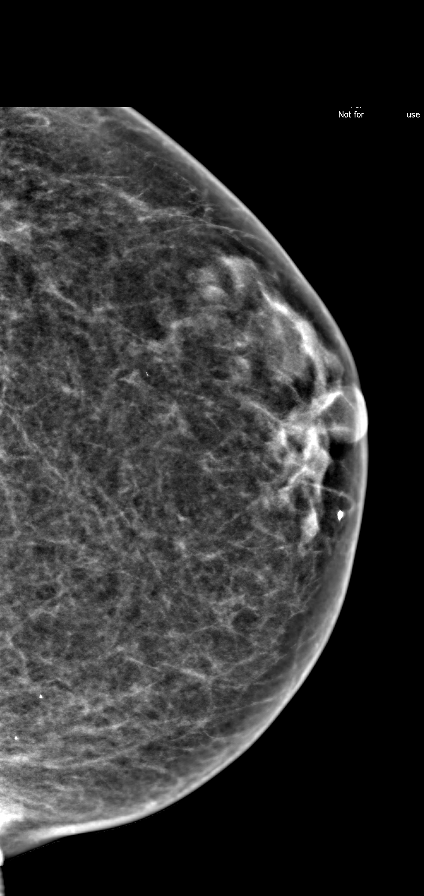

[R MLO (1 of 2)]
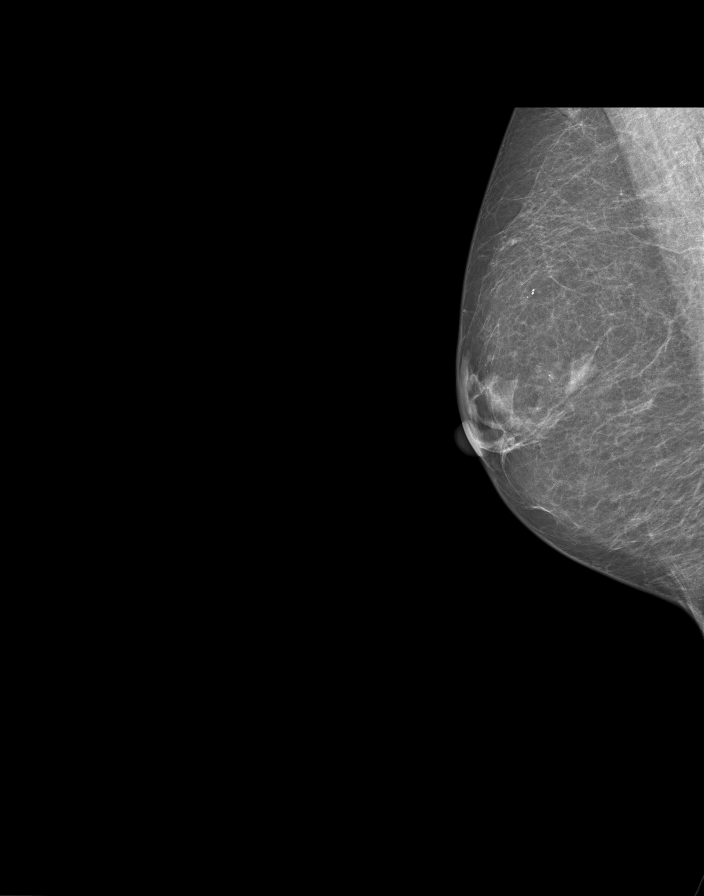

[R tomo (2 of 2)]
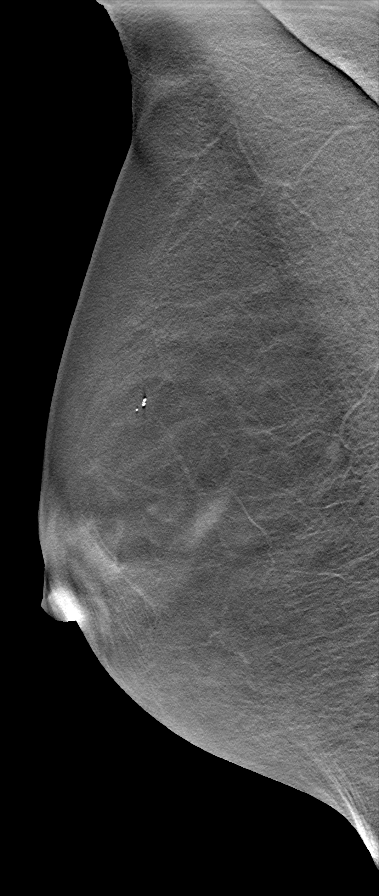

[R MLO (2 of 2)]
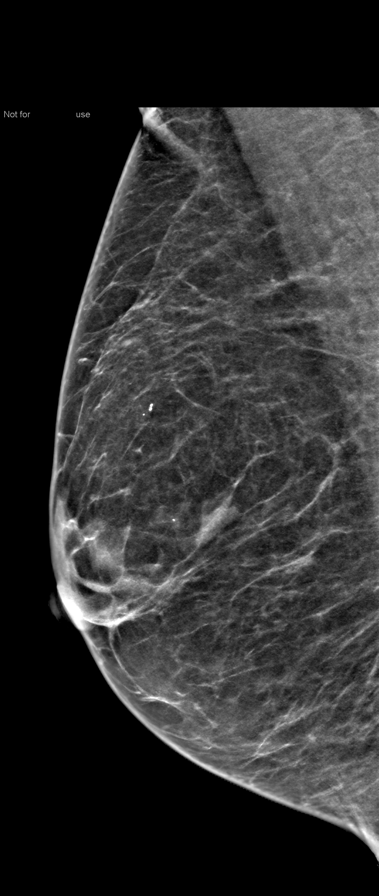

[R (2 of 2)]
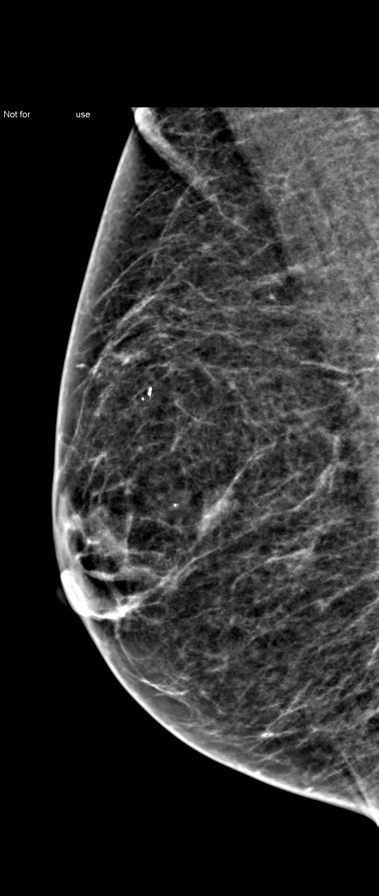

[12 of 16 positions shown; findings below may reference images not displayed]

EXAM

BILATERAL DIGITAL SCREENING MAMMOGRAM, CPT [id number]; WITH CAD

INDICATION

screening
SCR 3D. LM

TECHNIQUE

Digital 2D CC and MLO projections obtained with 3D tomographic views per manufacturer's protocol.
ICAD version 7.2 was used during this exam.

COMPARISONS

Previous examination dated 04/03/2019.

FINDINGS

The breasts are fatty replaced scattered heterogeneous fibroglandular structures. There are no
suspicious spiculated masses. There are no suspicious clusters of micro calcifications. There is no
evidence of skin thickening or nipple retraction. There is no evidence of axillary lymphadenopathy.
Benign calcifications were identified. Pacemaker battery pack projects over the left axillary
region.

IMPRESSION

No localizing signs of malignancy. Stable appearance of the breast.

BI-RADS 2, BENIGN.

A reminder letter will be sent.

A twelve month screening mammogram is recommended and a follow-up letter will be scheduled.

Computer Aided Detection was utilized for this interpretation.

A. A negative x-ray report should not delay biopsy if a dominant or clinically suspicious mass is
present.

B. A small percentage (probably less than 10%) of cancers will not be identified by x-ray, and
there will be a similar small percentage of false positive reports.

C. Adenosis and dense breasts may obscure an underlying neoplasm.

Tech Notes:

## 2021-04-28 IMAGING — CR [ID]
1 series · 1 of 1 positions shown · non-contrast
Comparison: none

[chest ap]
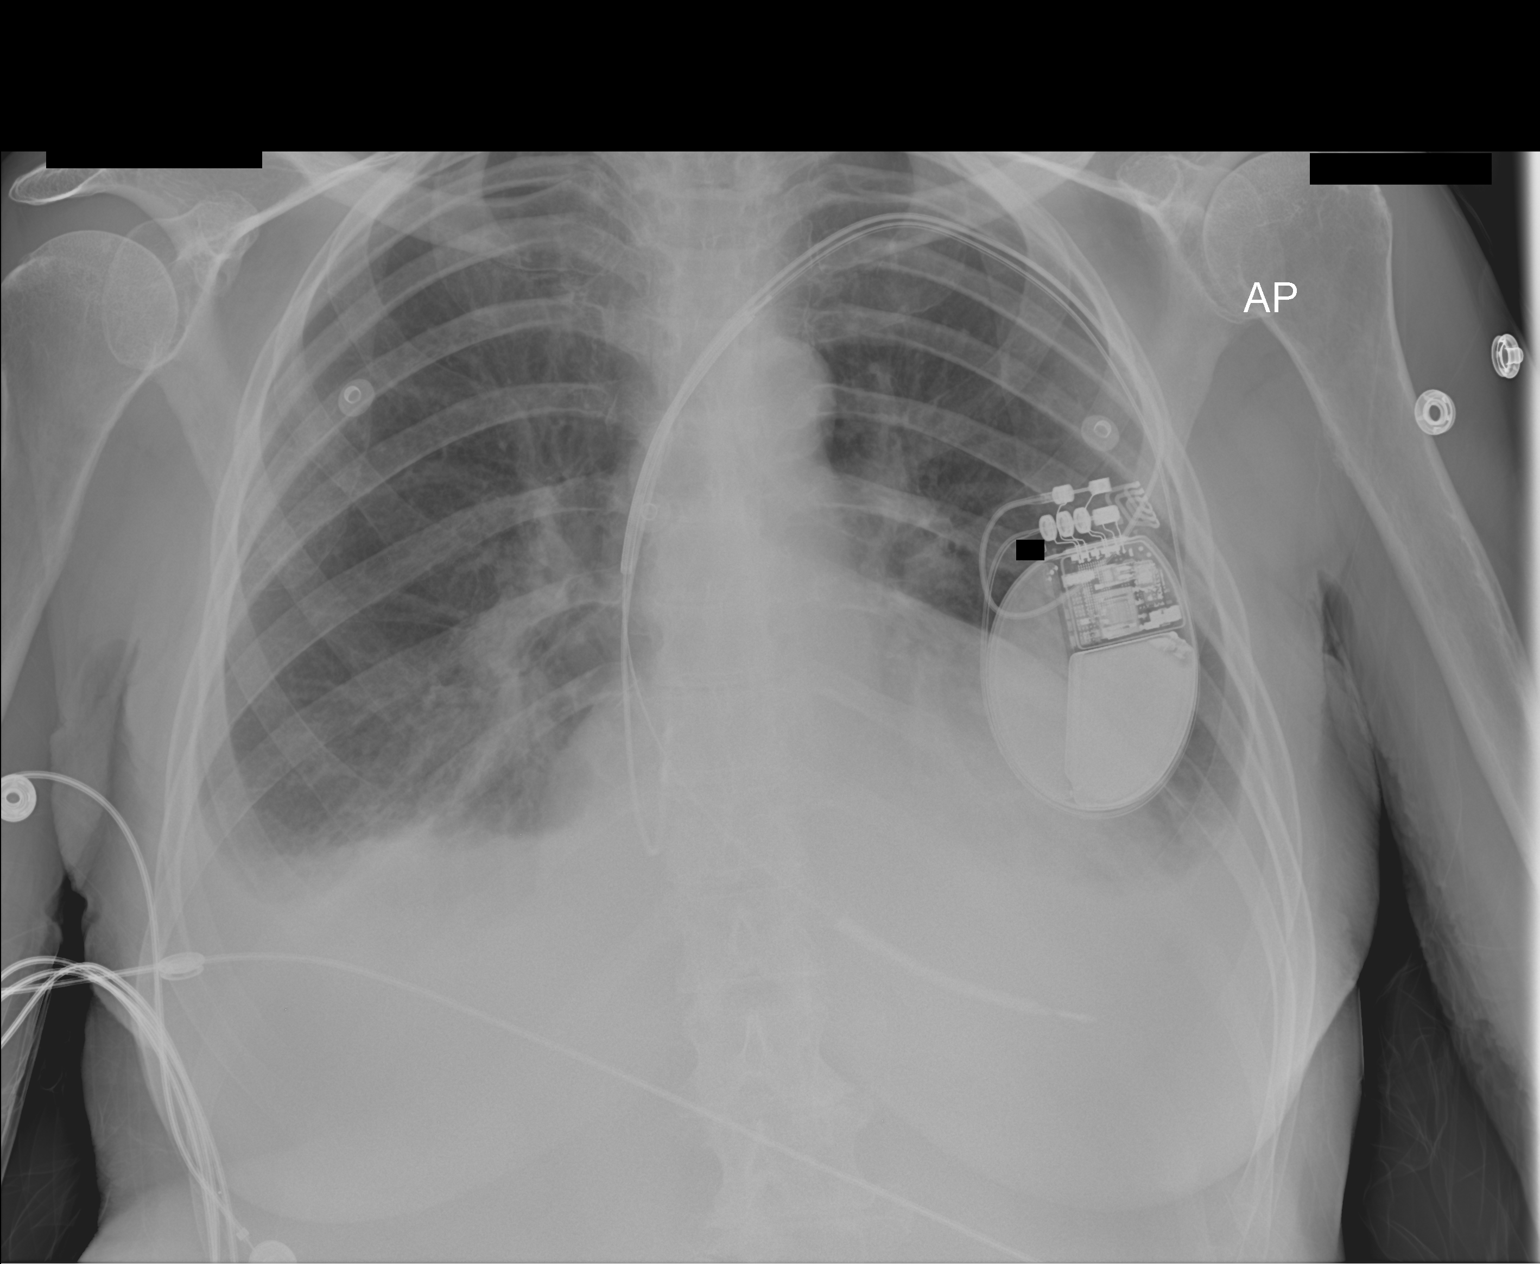

[1 of 1 positions shown; findings below may reference images not displayed]

DIAGNOSTIC STUDIES

EXAM

XR chest 1V

INDICATION

sob
SOB that started today, hx of defibrillator. KF/TM

TECHNIQUE

Portable AP chest.

COMPARISONS

None avail

FINDINGS

Pacemaker is in place. There is borderline cardiomegaly. Moderate bilateral pleural effusions and
bibasilar infiltrates are seen. Osseous structures are within normal limits.

IMPRESSION

Moderate bilateral pleural effusions and bibasilar consolidation.

Borderline cardiomegaly with pacemaker in place.

Tech Notes:

SOB that started today, hx of defibrillator. KF/TM
